# Patient Record
Sex: Male | Born: 1945 | Race: White | Hispanic: Yes | Marital: Married | Smoking: Former smoker
Health system: Southern US, Community
[De-identification: ages and names within clinical notes are randomized; demographics above are authoritative.]

## PROBLEM LIST (undated history)

## (undated) DIAGNOSIS — I4892 Unspecified atrial flutter: Secondary | ICD-10-CM

## (undated) DIAGNOSIS — E119 Type 2 diabetes mellitus without complications: Secondary | ICD-10-CM

## (undated) DIAGNOSIS — N189 Chronic kidney disease, unspecified: Secondary | ICD-10-CM

## (undated) DIAGNOSIS — I509 Heart failure, unspecified: Secondary | ICD-10-CM

## (undated) DIAGNOSIS — I82409 Acute embolism and thrombosis of unspecified deep veins of unspecified lower extremity: Secondary | ICD-10-CM

## (undated) DIAGNOSIS — E785 Hyperlipidemia, unspecified: Secondary | ICD-10-CM

## (undated) DIAGNOSIS — J45909 Unspecified asthma, uncomplicated: Secondary | ICD-10-CM

## (undated) DIAGNOSIS — I480 Paroxysmal atrial fibrillation: Secondary | ICD-10-CM

## (undated) DIAGNOSIS — I429 Cardiomyopathy, unspecified: Secondary | ICD-10-CM

## (undated) DIAGNOSIS — E079 Disorder of thyroid, unspecified: Secondary | ICD-10-CM

## (undated) DIAGNOSIS — I10 Essential (primary) hypertension: Secondary | ICD-10-CM

## (undated) HISTORY — DX: Disorder of thyroid, unspecified: E07.9

## (undated) HISTORY — DX: Type 2 diabetes mellitus without complications: E11.9

## (undated) HISTORY — PX: HERNIA REPAIR: SHX51

## (undated) HISTORY — DX: Hyperlipidemia, unspecified: E78.5

## (undated) HISTORY — DX: Essential (primary) hypertension: I10

---

## 2010-11-05 ENCOUNTER — Other Ambulatory Visit: Payer: Self-pay | Admitting: Specialist

## 2010-11-05 DIAGNOSIS — I82409 Acute embolism and thrombosis of unspecified deep veins of unspecified lower extremity: Secondary | ICD-10-CM

## 2010-11-05 DIAGNOSIS — R52 Pain, unspecified: Secondary | ICD-10-CM

## 2010-11-05 DIAGNOSIS — R609 Edema, unspecified: Secondary | ICD-10-CM

## 2010-11-08 ENCOUNTER — Ambulatory Visit
Admission: RE | Admit: 2010-11-08 | Discharge: 2010-11-08 | Disposition: A | Discharge status: Home / Self Care | Payer: Self-pay | Source: Ambulatory Visit | Attending: Specialist | Admitting: Specialist

## 2010-11-08 DIAGNOSIS — R52 Pain, unspecified: Secondary | ICD-10-CM

## 2010-11-08 DIAGNOSIS — R609 Edema, unspecified: Secondary | ICD-10-CM

## 2010-11-08 DIAGNOSIS — I82409 Acute embolism and thrombosis of unspecified deep veins of unspecified lower extremity: Secondary | ICD-10-CM

## 2011-04-29 ENCOUNTER — Other Ambulatory Visit: Payer: Self-pay | Admitting: Geriatric Medicine

## 2011-04-29 ENCOUNTER — Ambulatory Visit
Admission: RE | Admit: 2011-04-29 | Discharge: 2011-04-29 | Disposition: A | Discharge status: Home / Self Care | Payer: No Typology Code available for payment source | Source: Ambulatory Visit | Attending: Geriatric Medicine | Admitting: Geriatric Medicine

## 2011-04-29 DIAGNOSIS — R05 Cough: Secondary | ICD-10-CM

## 2013-03-02 IMAGING — US US EXTREM LOW VENOUS*R*
1 series · 14 of 24 positions shown · non-contrast
Comparison: None

CLINICAL DATA: Pain and swelling

RIGHT LOWER EXTREMITY VENOUS DOPPLER ULTRASOUND
TECHNIQUE: Gray-scale sonography with compression, as well as color
and duplex ultrasound, were performed to evaluate the deep venous
system from the level of the common femoral vein through the
popliteal and proximal calf veins.

[Series 1: us extrem low venous*right* · 14 of 30 slices shown]
[im 1/30]
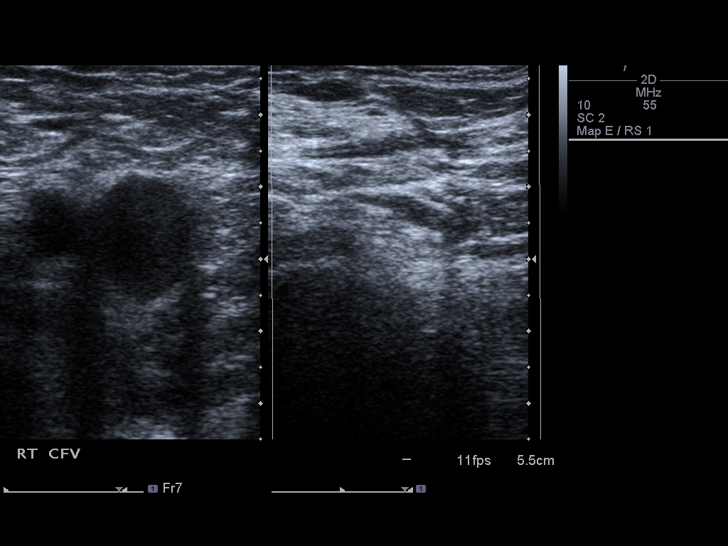
[im 3/30]
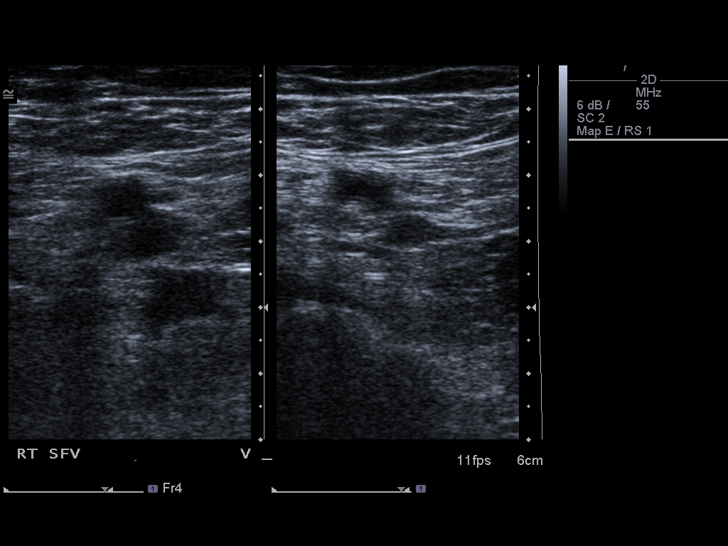
[im 6/30]
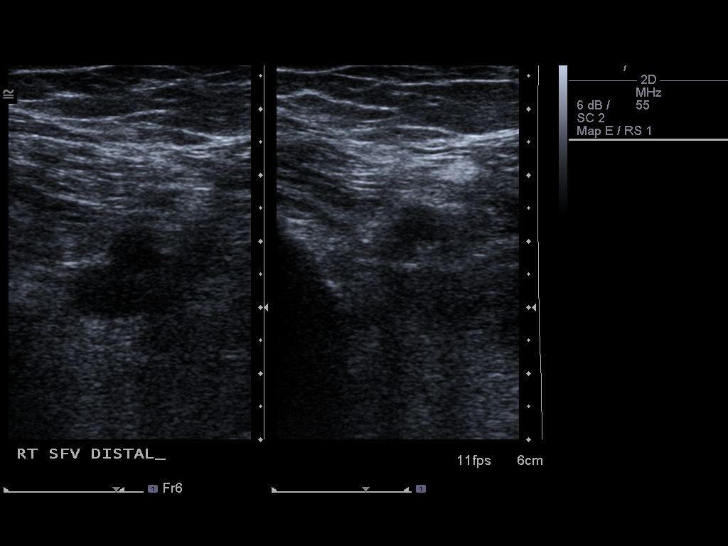
[im 8/30]
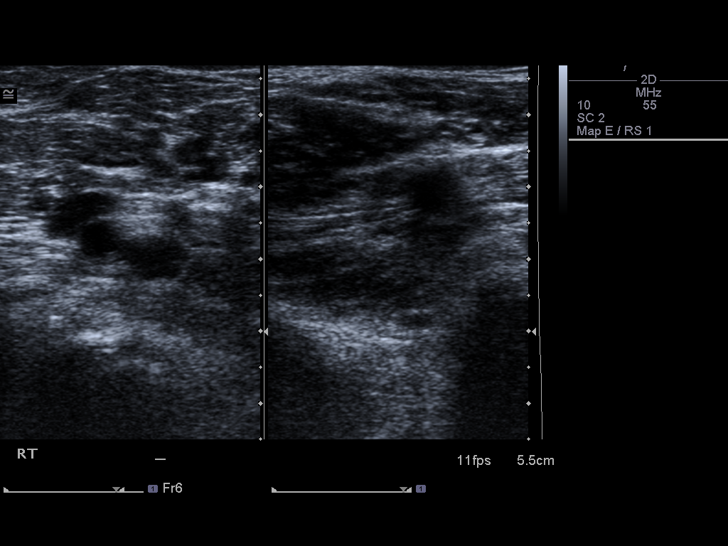
[im 9/30]
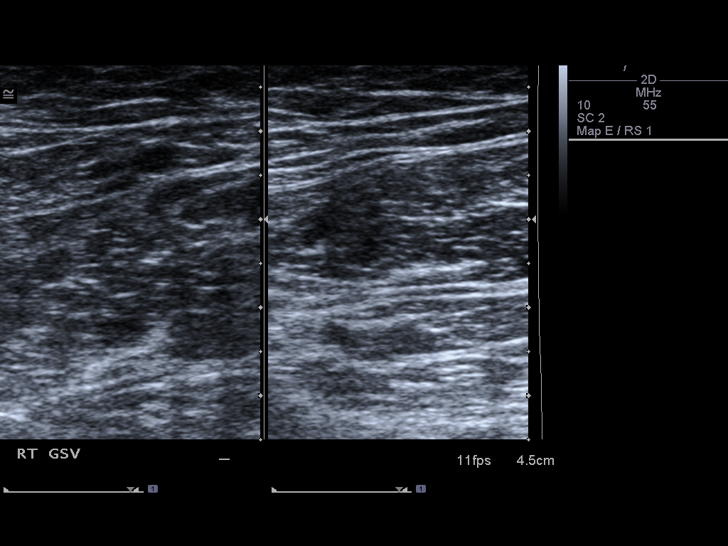
[im 12/30]
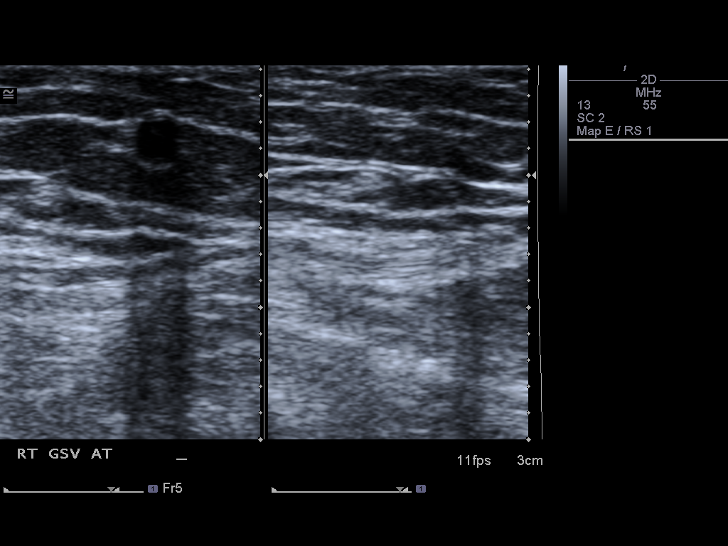
[im 14/30]
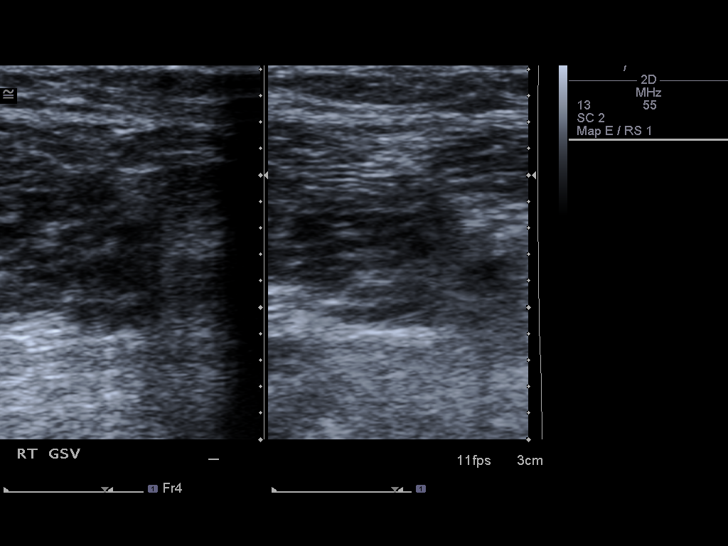
[im 16/30]
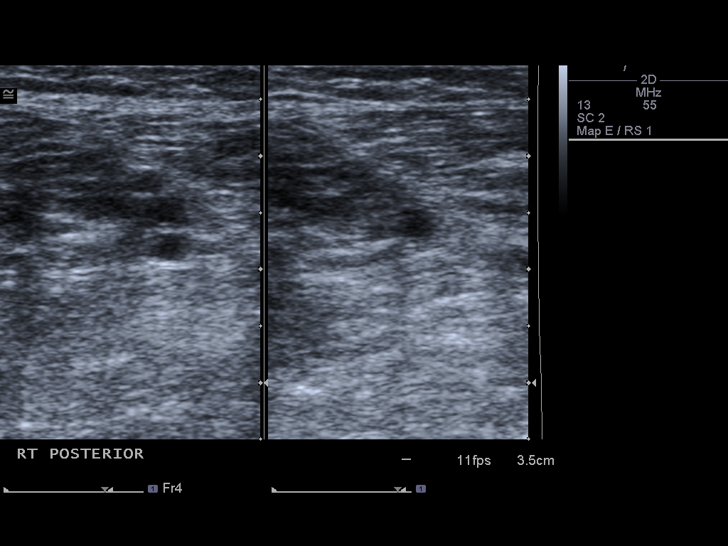
[im 18/30]
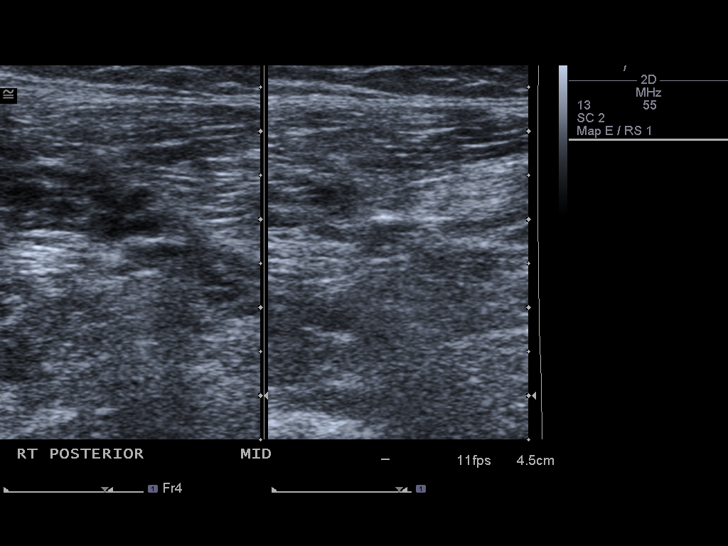
[im 21/30]
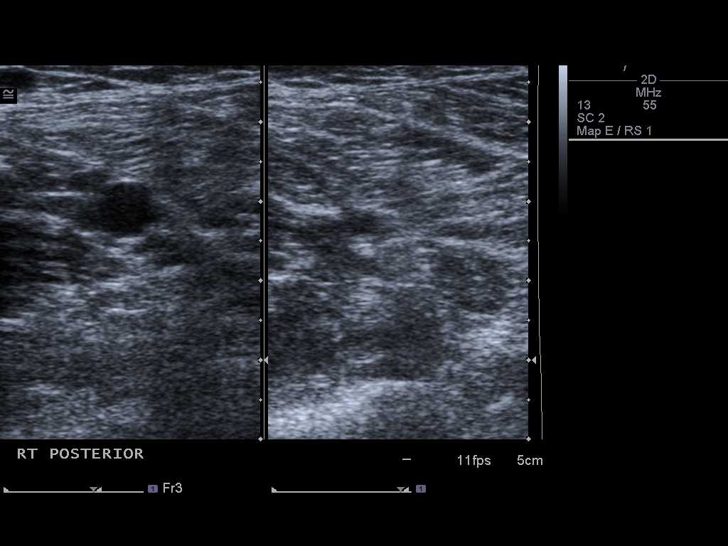
[im 23/30]
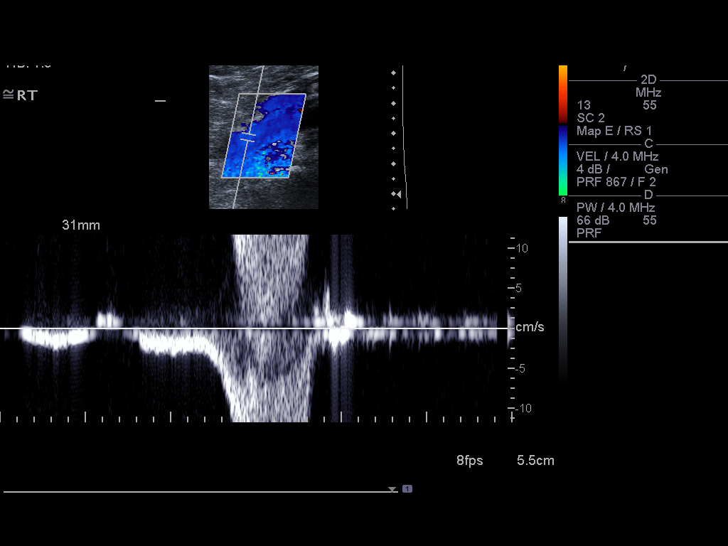
[im 24/30]
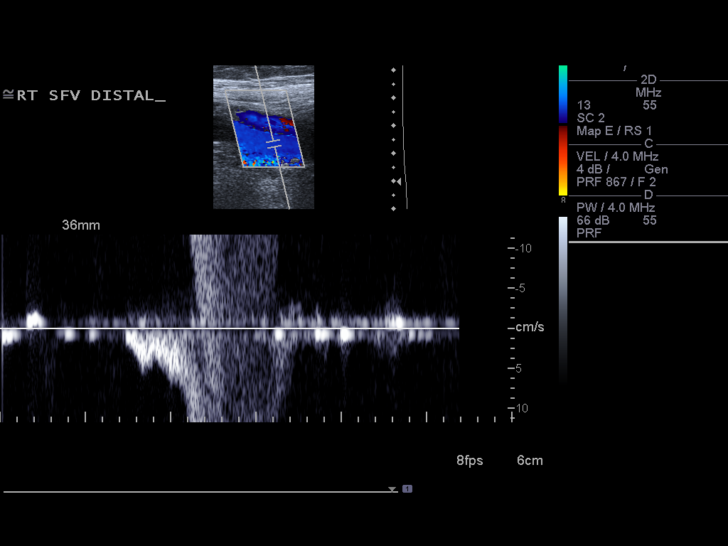
[im 27/30]
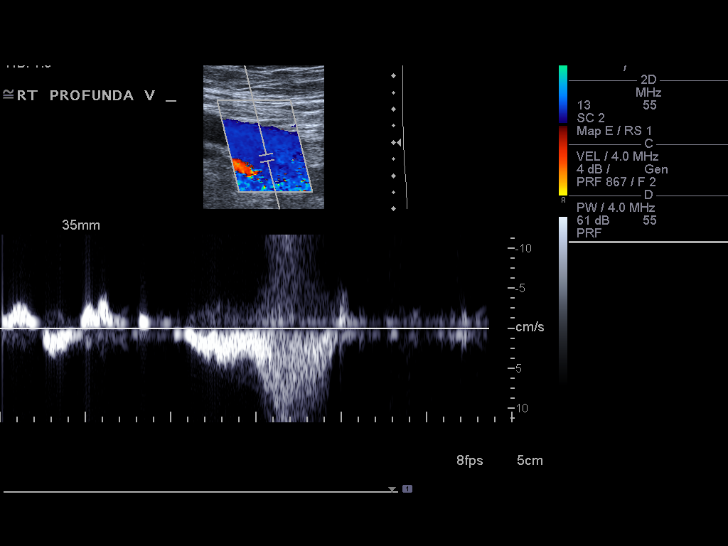
[im 30/30]
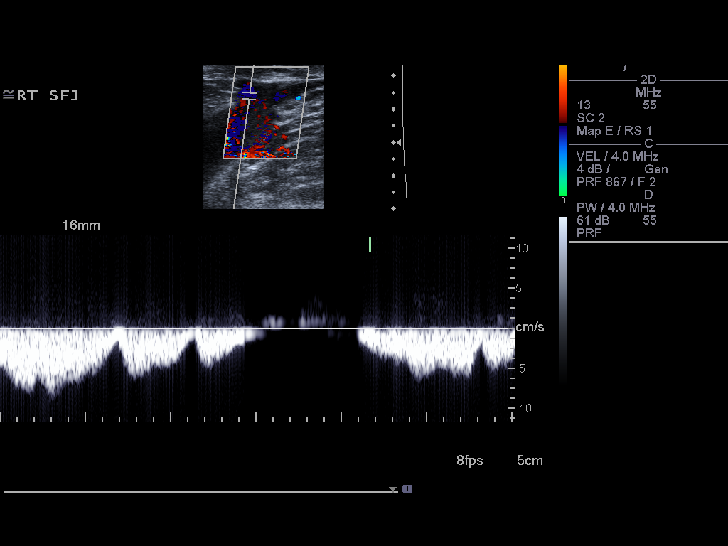

[14 of 24 positions shown; findings below may reference images not displayed]

FINDINGS: Normal compressibility of  the common femoral,
superficial femoral, and popliteal veins, as well as the proximal
calf veins.  No filling defects to suggest DVT on grayscale or
color Doppler imaging.  Doppler waveforms show normal direction of
venous flow, normal respiratory phasicity and response to
augmentation.
IMPRESSION: No evidence of  lower extremity deep vein thrombosis.

## 2014-09-15 ENCOUNTER — Ambulatory Visit (INDEPENDENT_AMBULATORY_CARE_PROVIDER_SITE_OTHER): Payer: Medicare Other | Admitting: Family Medicine

## 2014-09-15 ENCOUNTER — Ambulatory Visit (INDEPENDENT_AMBULATORY_CARE_PROVIDER_SITE_OTHER): Payer: Medicare Other

## 2014-09-15 VITALS — BP 126/80 | HR 92 | Temp 99.2°F | Resp 17 | Ht 65.5 in | Wt 186.0 lb

## 2014-09-15 DIAGNOSIS — E1142 Type 2 diabetes mellitus with diabetic polyneuropathy: Secondary | ICD-10-CM

## 2014-09-15 DIAGNOSIS — R05 Cough: Secondary | ICD-10-CM

## 2014-09-15 DIAGNOSIS — R059 Cough, unspecified: Secondary | ICD-10-CM

## 2014-09-15 DIAGNOSIS — E785 Hyperlipidemia, unspecified: Secondary | ICD-10-CM | POA: Diagnosis not present

## 2014-09-15 DIAGNOSIS — E039 Hypothyroidism, unspecified: Secondary | ICD-10-CM | POA: Diagnosis not present

## 2014-09-15 DIAGNOSIS — I10 Essential (primary) hypertension: Secondary | ICD-10-CM

## 2014-09-15 DIAGNOSIS — J988 Other specified respiratory disorders: Secondary | ICD-10-CM

## 2014-09-15 DIAGNOSIS — R0602 Shortness of breath: Secondary | ICD-10-CM | POA: Diagnosis not present

## 2014-09-15 DIAGNOSIS — J22 Unspecified acute lower respiratory infection: Secondary | ICD-10-CM

## 2014-09-15 LAB — POCT CBC
Granulocyte percent: 78.9 %G (ref 37–80)
HCT, POC: 38.2 % — AB (ref 43.5–53.7)
Hemoglobin: 12.5 g/dL — AB (ref 14.1–18.1)
Lymph, poc: 1.8 (ref 0.6–3.4)
MCH, POC: 29.1 pg (ref 27–31.2)
MCHC: 32.8 g/dL (ref 31.8–35.4)
MCV: 88.7 fL (ref 80–97)
MID (cbc): 0.7 (ref 0–0.9)
MPV: 6.6 fL (ref 0–99.8)
POC Granulocyte: 9.3 — AB (ref 2–6.9)
POC LYMPH PERCENT: 15 % (ref 10–50)
POC MID %: 6.1 %M (ref 0–12)
Platelet Count, POC: 258 10*3/uL (ref 142–424)
RBC: 4.31 M/uL — AB (ref 4.69–6.13)
RDW, POC: 14.3 %
WBC: 11.8 10*3/uL — AB (ref 4.6–10.2)

## 2014-09-15 LAB — GLUCOSE, POCT (MANUAL RESULT ENTRY): POC Glucose: 204 mg/dl — AB (ref 70–99)

## 2014-09-15 LAB — POCT GLYCOSYLATED HEMOGLOBIN (HGB A1C): Hemoglobin A1C: 7.4

## 2014-09-15 MED ORDER — IPRATROPIUM-ALBUTEROL 0.5-2.5 (3) MG/3ML IN SOLN: 3.0000 mL | Freq: Four times a day (QID) | RESPIRATORY_TRACT | Status: DC | PRN

## 2014-09-15 MED ORDER — AZITHROMYCIN 250 MG PO TABS: ORAL_TABLET | ORAL | Status: DC

## 2014-09-15 MED ORDER — HYDROCODONE-HOMATROPINE 5-1.5 MG/5ML PO SYRP: 5.0000 mL | ORAL_SOLUTION | Freq: Every evening | ORAL | Status: DC | PRN

## 2014-09-15 MED ORDER — ALBUTEROL SULFATE (2.5 MG/3ML) 0.083% IN NEBU
2.5000 mg | INHALATION_SOLUTION | Freq: Once | RESPIRATORY_TRACT | Status: AC
  Administered 2014-09-15: 2.5 mg via RESPIRATORY_TRACT

## 2014-09-15 MED ORDER — CEFTRIAXONE SODIUM 1 G IJ SOLR
1.0000 g | Freq: Once | INTRAMUSCULAR | Status: AC
  Administered 2014-09-15: 1 g via INTRAMUSCULAR

## 2014-09-15 MED ORDER — IPRATROPIUM BROMIDE 0.02 % IN SOLN
0.5000 mg | Freq: Once | RESPIRATORY_TRACT | Status: AC
  Administered 2014-09-15: 0.5 mg via RESPIRATORY_TRACT

## 2014-09-15 MED ORDER — METHYLPREDNISOLONE (PAK) 4 MG PO TABS: ORAL_TABLET | ORAL | Status: DC

## 2014-09-15 NOTE — Progress Notes (Signed)
Chief Complaint:  Chief Complaint  Patient presents with  . Cough  . URI  . Nasal Congestion    HPI: William Velasquez is a 69 y.o. male who is here for  cough symptoms, upper respiratory symptoms, nasal congestion; Feeling sick since THursday. He is here for check has any pneumonia. He is short of breath. He has had chills but no fevers. He got the flu vaccine. He feels his body is sore.He is coughing up productive green sputum, has tried theraflu tea and then cough has improved some. He denies any nausea, vomiting, abdominal pain, rashes, tachycardia. He had some leftover albuterol which was also expired, and has used it in his nebulizer machine. He takes his medications for his chronic illnesses regular and does not have any side effects. 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  Past Medical History  Diagnosis Date  . Diabetes mellitus without complication   . Hyperlipidemia   . Hypertension   . Thyroid disease    Past Surgical History  Procedure Laterality Date  . Hernia repair      inguinal   History   Social History  . Marital Status: Married    Spouse Name: N/A  . Number of Children: N/A  . Years of Education: N/A   Social History Main Topics  . Smoking status: Never Smoker   . Smokeless tobacco: Not on file  . Alcohol Use: Not on file  . Drug Use: Not on file  . Sexual Activity: Not on file   Other Topics Concern  . None   Social History Narrative  . None   History reviewed. No pertinent family history. Not on File Prior to Admission medications   Not on File     ROS: The patient denies fevers, night sweats, unintentional weight loss, chest pain, palpitations,  nausea, vomiting, abdominal pain, dysuria, hematuria, melena, numbness, weakness, or tingling.   All other systems have been reviewed and were otherwise negative with the exception of those mentioned in the HPI and as above.     PHYSICAL EXAM: Filed Vitals:   09/15/14 1214  BP: 126/80  Pulse: 92  Temp: 99.2 F (37.3 C)  Resp: 17   SpO2 Readings from Last 3 Encounters:  09/15/14 96%    Filed Vitals:   09/15/14 1214  Height: 5' 5.5" (1.664 m)  Weight: 186 lb (84.369 kg)   Body mass index is 30.47 kg/(m^2).  General: Alert, no acute distress, tired appearing HEENT:  Normocephalic, atraumatic, oropharynx patent. EOMI, PERRLA. Tympanic membrane normal, no tonsillar swelling, erythema or exudate Cardiovascular:  Regular rate and rhythm, no rubs murmurs or gallops.  No Carotid bruits, radial pulse intact. No pedal edema.  Respiratory: No wheezes, rales,  + rhonchi.  No cyanosis, no use of accessory musculature GI: No organomegaly, abdomen is soft and non-tender, positive bowel sounds.  No masses. Skin: No rashes. Neurologic: Facial musculature symmetric. Psychiatric: Patient is appropriate throughout our interaction. Lymphatic: No cervical lymphadenopathy Musculoskeletal: Gait intact.   LABS: Results for orders placed or performed in visit on 09/15/14  POCT glycosylated hemoglobin (Hb A1C)  Result Value Ref Range   Hemoglobin A1C 7.4   POCT glucose (manual entry)  Result Value Ref Range   POC Glucose 204 (A) 70 - 99 mg/dl  POCT CBC  Result Value Ref Range   WBC 11.8 (A) 4.6 - 10.2 K/uL   Lymph, poc 1.8 0.6 - 3.4   POC  LYMPH PERCENT 15.0 10 - 50 %L   MID (cbc) 0.7 0 - 0.9   POC MID % 6.1 0 - 12 %M   POC Granulocyte 9.3 (A) 2 - 6.9   Granulocyte percent 78.9 37 - 80 %G   RBC 4.31 (A) 4.69 - 6.13 M/uL   Hemoglobin 12.5 (A) 14.1 - 18.1 g/dL   HCT, POC 16.1 (A) 09.6 - 53.7 %   MCV 88.7 80 - 97 fL   MCH, POC 29.1 27 - 31.2 pg   MCHC 32.8 31.8 - 35.4 g/dL   RDW, POC 04.5 %   Platelet Count, POC 258 142 - 424 K/uL   MPV 6.6 0 - 99.8 fL     EKG/XRAY:   Primary read interpreted by Dr. Conley Rolls at [facility]. ? Pneumonia in Right mid-lower lobe, increase intersitial markings No  effusion    ASSESSMENT/PLAN: Encounter Diagnoses  Name Primary?  . Cough   . Type 2 diabetes mellitus with diabetic polyneuropathy   . SOB (shortness of breath)   . Lower respiratory infection (e.g., bronchitis, pneumonia, pneumonitis, pulmonitis)    William Velasquez is a pleasant 69 year old William Velasquez with a past medical history of hypertension, hyperlipidemia, type 2 diabetes, hypothyroidism who presents with a lower respiratory infection suspicious of pneumonia on x-ray. I did not test him for the flu since he is outside the window for treatment with Tamiflu. He has had a flu vaccine earlier in the year. He initially presented with an O2 sat duration of 92% after a Atrovent and albuterol nebulizer treatment he went up to 96% and felt better. Rocephin 1 g 1 in the office was given He was prescribed azithromycin to cover for pneumonia/bronchitis. He was also prescribed a Medrol Dosepak, Hycodan syrup to use nightly as needed, DuoNeb nebulizer solution to use every 6 hours while he's feeling poorly. He was also asked to use Mucinex Follow-up in one day if he is feeling worse or go to the ER when necessary, otherwise follow-up in 48 hours.  Gross sideeffects, risk and benefits, and alternatives of medications d/w patient. Patient is aware that all medications have potential sideeffects and we are unable to predict every sideeffect or drug-drug interaction that may occur.  William Garguilo PHUONG, DO 09/15/2014 2:15 PM

## 2014-09-15 NOTE — Patient Instructions (Signed)

## 2014-09-16 ENCOUNTER — Encounter: Payer: Self-pay | Admitting: Family Medicine

## 2014-09-16 DIAGNOSIS — E1142 Type 2 diabetes mellitus with diabetic polyneuropathy: Secondary | ICD-10-CM | POA: Insufficient documentation

## 2014-09-16 DIAGNOSIS — E039 Hypothyroidism, unspecified: Secondary | ICD-10-CM | POA: Insufficient documentation

## 2014-09-16 DIAGNOSIS — I10 Essential (primary) hypertension: Secondary | ICD-10-CM | POA: Insufficient documentation

## 2014-09-16 DIAGNOSIS — E785 Hyperlipidemia, unspecified: Secondary | ICD-10-CM | POA: Insufficient documentation

## 2014-09-16 LAB — COMPLETE METABOLIC PANEL WITH GFR
ALT: 12 U/L (ref 0–53)
AST: 12 U/L (ref 0–37)
Albumin: 3.8 g/dL (ref 3.5–5.2)
Alkaline Phosphatase: 60 U/L (ref 39–117)
BUN: 13 mg/dL (ref 6–23)
CO2: 29 mEq/L (ref 19–32)
Calcium: 9 mg/dL (ref 8.4–10.5)
Chloride: 97 mEq/L (ref 96–112)
Creat: 1 mg/dL (ref 0.50–1.35)
GFR, Est African American: >89 mL/min
GFR, Est Non African American: 78 mL/min
Glucose, Bld: 193 mg/dL — ABNORMAL HIGH (ref 70–99)
Potassium: 4.4 mEq/L (ref 3.5–5.3)
Sodium: 134 mEq/L — ABNORMAL LOW (ref 135–145)
Total Bilirubin: 0.6 mg/dL (ref 0.2–1.2)
Total Protein: 6.7 g/dL (ref 6.0–8.3)

## 2014-09-17 ENCOUNTER — Ambulatory Visit (INDEPENDENT_AMBULATORY_CARE_PROVIDER_SITE_OTHER): Payer: Medicare Other | Admitting: Family Medicine

## 2014-09-17 ENCOUNTER — Telehealth: Payer: Self-pay

## 2014-09-17 VITALS — BP 110/68 | HR 68 | Temp 97.9°F | Resp 16 | Ht 65.5 in | Wt 186.0 lb

## 2014-09-17 DIAGNOSIS — J189 Pneumonia, unspecified organism: Secondary | ICD-10-CM

## 2014-09-17 DIAGNOSIS — J040 Acute laryngitis: Secondary | ICD-10-CM | POA: Diagnosis not present

## 2014-09-17 NOTE — Patient Instructions (Signed)
Drink lots of water  Continue your current medications  The azithromycin will last in your system for 3 or 4 days after you have finished taking the pills. It will still be working on the pneumonia.  The laryngitis will get better when the cough and drainage improve  If you're getting worse at anytime, with more shortness of breath or running a high fever come back. If you are not doing much better by Saturday or Sunday come back for one more recheck.

## 2014-09-17 NOTE — Telephone Encounter (Signed)
[facility] [place] is calling because they needs the diagnoses code for chronic respiratory condition. The Pharmacist states that two other codes were sent but they are not work.

## 2014-09-17 NOTE — Progress Notes (Signed)
Subjective: Patient is here for a follow-up of the pneumonia that he was here for a few days ago. He is feeling better though not well yet. Taking his medications. Wanted to know if he needed another shot.  Objective: Throat clear. Neck supple without nodes. Chest is clear to auscultation. Heart regular without murmurs.  Assessment: Pneumonia improving  Plan: Return in 3 or 4 days if not much better in which case we would probably repeat another chest x-ray. Decided that was not yet needed today. Did not repeat the CBC because it would probably be elevated from the steroids.

## 2014-09-18 ENCOUNTER — Other Ambulatory Visit: Payer: Self-pay | Admitting: Family Medicine

## 2014-09-18 DIAGNOSIS — J189 Pneumonia, unspecified organism: Secondary | ICD-10-CM

## 2014-09-18 DIAGNOSIS — R05 Cough: Secondary | ICD-10-CM

## 2014-09-18 DIAGNOSIS — R059 Cough, unspecified: Secondary | ICD-10-CM

## 2014-09-18 DIAGNOSIS — R0602 Shortness of breath: Secondary | ICD-10-CM

## 2014-09-18 MED ORDER — ALBUTEROL SULFATE (2.5 MG/3ML) 0.083% IN NEBU: 2.5000 mg | INHALATION_SOLUTION | RESPIRATORY_TRACT | Status: DC | PRN

## 2014-09-18 MED ORDER — IPRATROPIUM BROMIDE 0.02 % IN SOLN: 0.5000 mg | Freq: Four times a day (QID) | RESPIRATORY_TRACT | Status: DC

## 2014-09-18 MED ORDER — ALBUTEROL SULFATE (2.5 MG/3ML) 0.083% IN NEBU: 2.5000 mg | INHALATION_SOLUTION | Freq: Four times a day (QID) | RESPIRATORY_TRACT | Status: DC | PRN

## 2014-09-18 MED ORDER — IPRATROPIUM BROMIDE 0.02 % IN SOLN: 0.5000 mg | Freq: Four times a day (QID) | RESPIRATORY_TRACT | Status: DC | PRN

## 2014-09-18 NOTE — Telephone Encounter (Signed)
I called in albuterol and atrovent separately . Pharmacist thinks it will be approved.

## 2014-09-18 NOTE — Telephone Encounter (Signed)
Dr Conley RollsLe, It looks like pharm probably needs another dx code to get the Duoneb covered. Must need a code for a chronic resp illness rather than the Dxs that are in your OV notes. Do you have another dx we can use for this?

## 2014-09-21 ENCOUNTER — Encounter: Payer: Self-pay | Admitting: Radiology

## 2014-09-27 ENCOUNTER — Encounter: Payer: Self-pay | Admitting: Family Medicine

## 2020-01-21 ENCOUNTER — Other Ambulatory Visit: Payer: Self-pay | Admitting: Orthopedic Surgery

## 2020-01-22 ENCOUNTER — Other Ambulatory Visit: Payer: Self-pay | Admitting: Orthopedic Surgery

## 2020-01-22 ENCOUNTER — Encounter (HOSPITAL_COMMUNITY): Payer: Self-pay

## 2020-01-22 NOTE — Patient Instructions (Addendum)
DUE TO COVID-19 ONLY ONE VISITOR ARE ALLOWED TO COME WITH YOU AND STAY IN THE WAITING ROOM ONLY DURING PRE OP AND PROCEDURE. THEN TWO VISITORS MAY VISIT WITH YOU IN YOUR PRIVATE ROOM DURING VISITING HOURS ONLY!! (10AM-8PM)   COVID SWAB TESTING MUST BE COMPLETED ON:     Monday, 01-27-20 @ 12:10 PM         [address] [place], [place] [address]  (Must self quarantine after testing. Follow instructions on handout.)        Your procedure is scheduled on:  Thursday, 01-30-20   Report to [facility] Main  Entrance     Report to admitting at 8:10 AM   Call this number if you have problems the morning of surgery [phone]   Do not eat food :After Midnight. May have liquids until  7:40 AM  day of surgery.Complete one G2 drink the morning of surgery at 7:40 AM  the day of surgery.   CLEAR LIQUID DIET  Foods Allowed                                                                     Foods Excluded  Water, Black Coffee and tea, regular and decaf            liquids that you cannot  Plain Jell-O in any flavor  (No red)                                      see through such as: Fruit ices (not with fruit pulp)                                      milk, soups, orange juice              Iced Popsicles (No red)                                      All solid food                                   Apple juices Sports drinks like Gatorade (No red) Lightly seasoned clear broth or consume(fat free) Sugar, honey syrup         Oral Hygiene is also important to reduce your risk of infection.                                     Remember - BRUSH YOUR TEETH THE MORNING OF SURGERY WITH YOUR REGULAR TOOTHPASTE.  AFTERWARDS NO WATER, GUM, CANDY OR MINTS   Do NOT smoke after Midnight   Take these medicines the morning of surgery with A SIP OF WATER: Atorvastatin (Lipitor), Carvedilol (Coreg), Levothyroxine. Ok to use inhalers  DO NOT TAKE ANY ORAL DIABETIC MEDICATIONS DAY OF YOUR SURGERY  You may not have any metal on your body including jewelry, and body piercings              Do not wear make-up, lotions, powders, cologne, or deodorant                          Men may shave face and neck.   Do not bring valuables to the hospital. [facility] IS NOT RESPONSIBLE   FOR VALUABLES.   Contacts, dentures or bridgework may not be worn into surgery.   Bring small overnight bag day of surgery.    Patients discharged the day of surgery will not be allowed to drive home.   Special Instructions: Bring a copy of your healthcare power of attorney and living will documents the day of surgery if you haven't scanned them in before.              Please read over the following fact sheets you were given: IF YOU HAVE QUESTIONS ABOUT YOUR PRE OP INSTRUCTIONS PLEASE CALL  [phone]   How to Manage Your Diabetes Before and After Surgery  Why is it important to control my blood sugar before and after surgery? . Improving blood sugar levels before and after surgery helps healing and can limit problems. . A way of improving blood sugar control is eating a healthy diet by: o  Eating less sugar and carbohydrates o  Increasing activity/exercise o  Talking with your doctor about reaching your blood sugar goals . High blood sugars (greater than 180 mg/dL) can raise your risk of infections and slow your recovery, so you will need to focus on controlling your diabetes during the weeks before surgery. . Make sure that the doctor who takes care of your diabetes knows about your planned surgery including the date and location.  How do I manage my blood sugar before surgery? . Check your blood sugar at least 4 times a day, starting 2 days before surgery, to make sure that the level is not too high or low. o Check your blood sugar the morning of your surgery when you wake up and every 2 hours until you get to the Short Stay unit. . If your blood sugar is less than 70 mg/dL,  you will need to treat for low blood sugar: o Do not take insulin. o Treat a low blood sugar (less than 70 mg/dL) with  cup of clear juice (cranberry or apple), 4 glucose tablets, OR glucose gel. o Recheck blood sugar in 15 minutes after treatment (to make sure it is greater than 70 mg/dL). If your blood sugar is not greater than 70 mg/dL on recheck, call [phone] for further instructions. . Report your blood sugar to the short stay nurse when you get to Short Stay.  . If you are admitted to the hospital after surgery: o Your blood sugar will be checked by the staff and you will probably be given insulin after surgery (instead of oral diabetes medicines) to make sure you have good blood sugar levels. o The goal for blood sugar control after surgery is 80-180 mg/dL.   WHAT DO I DO ABOUT MY DIABETES MEDICATION?  Marland Kitchen Do not take oral diabetes medicines (pills) the morning of surgery.  . THE DAY BEFORE SURGERY, take your usual Metformin, and Janumet. However, DONOT TAKE YOUR JARDIANCE.         Reviewed and Endorsed by [facility] Patient Education Committee, August 2015  [facility] - Preparing for Surgery Before surgery, you can play an important role.  Because skin is not sterile, your skin needs to be as free of germs as possible.  You can reduce the number of germs on your skin by washing with CHG (chlorahexidine gluconate) soap before surgery.  CHG is an antiseptic cleaner which kills germs and bonds with the skin to continue killing germs even after washing. Please DO NOT use if you have an allergy to CHG or antibacterial soaps.  If your skin becomes reddened/irritated stop using the CHG and inform your nurse when you arrive at Short Stay. Do not shave (including legs and underarms) for at least 48 hours prior to the first CHG shower.  You may shave your face/neck.  Please follow these instructions carefully:  1.  Shower with CHG Soap the night before surgery and the  morning of  surgery.  2.  If you choose to wash your hair, wash your hair first as usual with your normal  shampoo.  3.  After you shampoo, rinse your hair and body thoroughly to remove the shampoo.                             4.  Use CHG as you would any other liquid soap.  You can apply chg directly to the skin and wash.  Gently with a scrungie or clean washcloth.  5.  Apply the CHG Soap to your body ONLY FROM THE NECK DOWN.   Do   not use on face/ open                           Wound or open sores. Avoid contact with eyes, ears mouth and   genitals (private parts).                       Wash face,  Genitals (private parts) with your normal soap.             6.  Wash thoroughly, paying special attention to the area where your    surgery  will be performed.  7.  Thoroughly rinse your body with warm water from the neck down.  8.  DO NOT shower/wash with your normal soap after using and rinsing off the CHG Soap.                9.  Pat yourself dry with a clean towel.            10.  Wear clean pajamas.            11.  Place clean sheets on your bed the night of your first shower and do not  sleep with pets. Day of Surgery : Do not apply any lotions/deodorants the morning of surgery.  Please wear clean clothes to the hospital/surgery center.  FAILURE TO FOLLOW THESE INSTRUCTIONS MAY RESULT IN THE CANCELLATION OF YOUR SURGERY  PATIENT SIGNATURE_________________________________  NURSE SIGNATURE__________________________________  ________________________________________________________________________    Rogelia Mire  An incentive spirometer is a tool that can help keep your lungs clear and active. This tool measures how well you are filling your lungs with each breath. Taking long deep breaths may help reverse or decrease the chance of developing breathing (pulmonary) problems (especially infection) following:  A long period of time when you are unable to move or be active. BEFORE THE  PROCEDURE  If the spirometer includes an indicator to show your best effort, your nurse or respiratory therapist will set it to a desired goal.  If possible, sit up straight or lean slightly forward. Try not to slouch.  Hold the incentive spirometer in an upright position. INSTRUCTIONS FOR USE  1. Sit on the edge of your bed if possible, or sit up as far as you can in bed or on a chair. 2. Hold the incentive spirometer in an upright position. 3. Breathe out normally. 4. Place the mouthpiece in your mouth and seal your lips tightly around it. 5. Breathe in slowly and as deeply as possible, raising the piston or the ball toward the top of the column. 6. Hold your breath for 3-5 seconds or for as long as possible. Allow the piston or ball to fall to the bottom of the column. 7. Remove the mouthpiece from your mouth and breathe out normally. 8. Rest for a few seconds and repeat Steps 1 through 7 at least 10 times every 1-2 hours when you are awake. Take your time and take a few normal breaths between deep breaths. 9. The spirometer may include an indicator to show your best effort. Use the indicator as a goal to work toward during each repetition. 10. After each set of 10 deep breaths, practice coughing to be sure your lungs are clear. If you have an incision (the cut made at the time of surgery), support your incision when coughing by placing a pillow or rolled up towels firmly against it. Once you are able to get out of bed, walk around indoors and cough well. You may stop using the incentive spirometer when instructed by your caregiver.  RISKS AND COMPLICATIONS  Take your time so you do not get dizzy or light-headed.  If you are in pain, you may need to take or ask for pain medication before doing incentive spirometry. It is harder to take a deep breath if you are having pain. AFTER USE  Rest and breathe slowly and easily.  It can be helpful to keep track of a log of your progress. Your  caregiver can provide you with a simple table to help with this. If you are using the spirometer at home, follow these instructions: SEEK MEDICAL CARE IF:   You are having difficultly using the spirometer.  You have trouble using the spirometer as often as instructed.  Your pain medication is not giving enough relief while using the spirometer.  You develop fever of 100.5 F (38.1 C) or higher. SEEK IMMEDIATE MEDICAL CARE IF:   You cough up bloody sputum that had not been present before.  You develop fever of 102 F (38.9 C) or greater.  You develop worsening pain at or near the incision site. MAKE SURE YOU:   Understand these instructions.  Will watch your condition.  Will get help right away if you are not doing well or get worse. Document Released: 10/24/2006 Document Revised: 09/05/2011 Document Reviewed: 12/25/2006 ExitCare Patient Information 2014 ExitCare, [place].   ________________________________________________________________________  WHAT IS A BLOOD TRANSFUSION? Blood Transfusion Information  A transfusion is the replacement of blood or some of its parts. Blood is made up of multiple cells which provide different functions.  Red blood cells carry oxygen and are used for blood loss replacement.  White blood cells fight against infection.  Platelets control bleeding.  Plasma helps clot blood.  Other blood products are available for specialized needs, such as hemophilia or other clotting disorders.  BEFORE THE TRANSFUSION  Who gives blood for transfusions?   Healthy volunteers who are fully evaluated to make sure their blood is safe. This is blood bank blood. Transfusion therapy is the safest it has ever been in the practice of medicine. Before blood is taken from a donor, a complete history is taken to make sure that person has no history of diseases nor engages in risky social behavior (examples are intravenous drug use or sexual activity with multiple  partners). The donor's travel history is screened to minimize risk of transmitting infections, such as malaria. The donated blood is tested for signs of infectious diseases, such as HIV and hepatitis. The blood is then tested to be sure it is compatible with you in order to minimize the chance of a transfusion reaction. If you or a relative donates blood, this is often done in anticipation of surgery and is not appropriate for emergency situations. It takes many days to process the donated blood. RISKS AND COMPLICATIONS Although transfusion therapy is very safe and saves many lives, the main dangers of transfusion include:   Getting an infectious disease.  Developing a transfusion reaction. This is an allergic reaction to something in the blood you were given. Every precaution is taken to prevent this. The decision to have a blood transfusion has been considered carefully by your caregiver before blood is given. Blood is not given unless the benefits outweigh the risks. AFTER THE TRANSFUSION  Right after receiving a blood transfusion, you will usually feel much better and more energetic. This is especially true if your red blood cells have gotten low (anemic). The transfusion raises the level of the red blood cells which carry oxygen, and this usually causes an energy increase.  The nurse administering the transfusion will monitor you carefully for complications. HOME CARE INSTRUCTIONS  No special instructions are needed after a transfusion. You may find your energy is better. Speak with your caregiver about any limitations on activity for underlying diseases you may have. SEEK MEDICAL CARE IF:   Your condition is not improving after your transfusion.  You develop redness or irritation at the intravenous (IV) site. SEEK IMMEDIATE MEDICAL CARE IF:  Any of the following symptoms occur over the next 12 hours:  Shaking chills.  You have a temperature by mouth above 102 F (38.9 C), not  controlled by medicine.  Chest, back, or muscle pain.  People around you feel you are not acting correctly or are confused.  Shortness of breath or difficulty breathing.  Dizziness and fainting.  You get a rash or develop hives.  You have a decrease in urine output.  Your urine turns a dark color or changes to pink, red, or brown. Any of the following symptoms occur over the next 10 days:  You have a temperature by mouth above 102 F (38.9 C), not controlled by medicine.  Shortness of breath.  Weakness after normal activity.  The white part of the eye turns yellow (jaundice).  You have a decrease in the amount of urine or are urinating less often.  Your urine turns a dark color or changes to pink, red, or brown. Document Released: 06/10/2000 Document Revised: 09/05/2011 Document Reviewed: 01/28/2008 [employer] [facility] Patient Information 2014 [place].  _______________________________________________________________________

## 2020-01-22 NOTE — Progress Notes (Signed)
COVID Vaccine Completed: x2 Date COVID Vaccine completed: 08-28-19 & 09-25-19 COVID vaccine manufacturer: Pfizer    Moderna   [employer]   PCP -  Cardiologist - Dr. Lorelei Pont [place].  Last OV 11-07-19  Clearance received and on chart  Chest x-ray -  EKG - 01-27-20 in Epic (PAT visit) Stress Test - 03-06-17 Care Everywhere ECHO - 11-07-19 Care Everywhere Cardiac Cath -   Sleep Study -  CPAP -   Fasting Blood Sugar -  Checks Blood Sugar _____ times a day Hgb A1c 7.4 01-06-20 in Care Everywhere  Blood Thinner Instructions: Xarelto Aspirin Instructions: ASA 325 Last Dose:  Anesthesia review: Afib, Aflutter, cardiomyopathy, CHF, HTN, DM II  Patient denies shortness of breath, fever, cough and chest pain at PAT appointment   Patient verbalized understanding of instructions that were given to them at the PAT appointment. Patient was also instructed that they will need to review over the PAT instructions again at home before surgery.

## 2020-01-27 ENCOUNTER — Other Ambulatory Visit (HOSPITAL_COMMUNITY): Payer: Self-pay

## 2020-01-27 ENCOUNTER — Encounter (HOSPITAL_COMMUNITY): Payer: Self-pay

## 2020-01-27 ENCOUNTER — Other Ambulatory Visit (HOSPITAL_COMMUNITY)
Admission: RE | Admit: 2020-01-27 | Discharge: 2020-01-27 | Disposition: A | Discharge status: Home / Self Care | Payer: No Typology Code available for payment source | Source: Ambulatory Visit | Attending: Orthopedic Surgery | Admitting: Orthopedic Surgery

## 2020-01-27 ENCOUNTER — Ambulatory Visit (HOSPITAL_COMMUNITY)
Admission: RE | Admit: 2020-01-27 | Discharge: 2020-01-27 | Disposition: A | Discharge status: Home / Self Care | Payer: No Typology Code available for payment source | Source: Ambulatory Visit | Attending: Orthopedic Surgery | Admitting: Orthopedic Surgery

## 2020-01-27 ENCOUNTER — Encounter (HOSPITAL_COMMUNITY)
Admission: RE | Admit: 2020-01-27 | Discharge: 2020-01-27 | Disposition: A | Discharge status: Home / Self Care | Payer: No Typology Code available for payment source | Source: Ambulatory Visit | Attending: Orthopedic Surgery | Admitting: Orthopedic Surgery

## 2020-01-27 ENCOUNTER — Other Ambulatory Visit: Payer: Self-pay

## 2020-01-27 DIAGNOSIS — I509 Heart failure, unspecified: Secondary | ICD-10-CM | POA: Insufficient documentation

## 2020-01-27 DIAGNOSIS — E785 Hyperlipidemia, unspecified: Secondary | ICD-10-CM | POA: Insufficient documentation

## 2020-01-27 DIAGNOSIS — Z01818 Encounter for other preprocedural examination: Secondary | ICD-10-CM | POA: Diagnosis present

## 2020-01-27 DIAGNOSIS — E1122 Type 2 diabetes mellitus with diabetic chronic kidney disease: Secondary | ICD-10-CM | POA: Diagnosis not present

## 2020-01-27 DIAGNOSIS — Z7902 Long term (current) use of antithrombotics/antiplatelets: Secondary | ICD-10-CM | POA: Diagnosis not present

## 2020-01-27 DIAGNOSIS — I4892 Unspecified atrial flutter: Secondary | ICD-10-CM | POA: Insufficient documentation

## 2020-01-27 DIAGNOSIS — Z01811 Encounter for preprocedural respiratory examination: Secondary | ICD-10-CM

## 2020-01-27 DIAGNOSIS — Z7982 Long term (current) use of aspirin: Secondary | ICD-10-CM | POA: Insufficient documentation

## 2020-01-27 DIAGNOSIS — Z20822 Contact with and (suspected) exposure to covid-19: Secondary | ICD-10-CM | POA: Diagnosis not present

## 2020-01-27 DIAGNOSIS — I480 Paroxysmal atrial fibrillation: Secondary | ICD-10-CM | POA: Diagnosis not present

## 2020-01-27 DIAGNOSIS — I130 Hypertensive heart and chronic kidney disease with heart failure and stage 1 through stage 4 chronic kidney disease, or unspecified chronic kidney disease: Secondary | ICD-10-CM | POA: Insufficient documentation

## 2020-01-27 DIAGNOSIS — Z79899 Other long term (current) drug therapy: Secondary | ICD-10-CM | POA: Diagnosis not present

## 2020-01-27 DIAGNOSIS — E079 Disorder of thyroid, unspecified: Secondary | ICD-10-CM | POA: Diagnosis not present

## 2020-01-27 DIAGNOSIS — M75101 Unspecified rotator cuff tear or rupture of right shoulder, not specified as traumatic: Secondary | ICD-10-CM | POA: Diagnosis not present

## 2020-01-27 DIAGNOSIS — I429 Cardiomyopathy, unspecified: Secondary | ICD-10-CM | POA: Insufficient documentation

## 2020-01-27 DIAGNOSIS — Z7901 Long term (current) use of anticoagulants: Secondary | ICD-10-CM | POA: Insufficient documentation

## 2020-01-27 DIAGNOSIS — N189 Chronic kidney disease, unspecified: Secondary | ICD-10-CM | POA: Diagnosis not present

## 2020-01-27 DIAGNOSIS — Z87891 Personal history of nicotine dependence: Secondary | ICD-10-CM | POA: Diagnosis not present

## 2020-01-27 HISTORY — DX: Unspecified asthma, uncomplicated: J45.909

## 2020-01-27 HISTORY — DX: Acute embolism and thrombosis of unspecified deep veins of unspecified lower extremity: I82.409

## 2020-01-27 HISTORY — DX: Cardiomyopathy, unspecified: I42.9

## 2020-01-27 HISTORY — DX: Heart failure, unspecified: I50.9

## 2020-01-27 HISTORY — DX: Chronic kidney disease, unspecified: N18.9

## 2020-01-27 HISTORY — DX: Unspecified atrial flutter: I48.92

## 2020-01-27 HISTORY — DX: Paroxysmal atrial fibrillation: I48.0

## 2020-01-27 LAB — URINALYSIS, ROUTINE W REFLEX MICROSCOPIC
Bacteria, UA: NONE SEEN
Bilirubin Urine: NEGATIVE
Glucose, UA: >=500 mg/dL — AB
Hgb urine dipstick: NEGATIVE
Ketones, ur: NEGATIVE mg/dL
Leukocytes,Ua: NEGATIVE
Nitrite: NEGATIVE
Protein, ur: NEGATIVE mg/dL
Specific Gravity, Urine: 1.006 (ref 1.005–1.030)
pH: 5 (ref 5.0–8.0)

## 2020-01-27 LAB — CBC WITH DIFFERENTIAL/PLATELET
Abs Immature Granulocytes: 0.01 10*3/uL (ref 0.00–0.07)
Basophils Absolute: 0.1 10*3/uL (ref 0.0–0.1)
Basophils Relative: 1 %
Eosinophils Absolute: 0.9 10*3/uL — ABNORMAL HIGH (ref 0.0–0.5)
Eosinophils Relative: 12 %
HCT: 44.6 % (ref 39.0–52.0)
Hemoglobin: 14.6 g/dL (ref 13.0–17.0)
Immature Granulocytes: 0 %
Lymphocytes Relative: 22 %
Lymphs Abs: 1.6 10*3/uL (ref 0.7–4.0)
MCH: 30.8 pg (ref 26.0–34.0)
MCHC: 32.7 g/dL (ref 30.0–36.0)
MCV: 94.1 fL (ref 80.0–100.0)
Monocytes Absolute: 0.8 10*3/uL (ref 0.1–1.0)
Monocytes Relative: 11 %
Neutro Abs: 4.2 10*3/uL (ref 1.7–7.7)
Neutrophils Relative %: 54 %
Platelets: 186 10*3/uL (ref 150–400)
RBC: 4.74 MIL/uL (ref 4.22–5.81)
RDW: 13.6 % (ref 11.5–15.5)
WBC: 7.6 10*3/uL (ref 4.0–10.5)
nRBC: 0 % (ref 0.0–0.2)

## 2020-01-27 LAB — PROTIME-INR
INR: 1.6 — ABNORMAL HIGH (ref 0.8–1.2)
Prothrombin Time: 18.5 seconds — ABNORMAL HIGH (ref 11.4–15.2)

## 2020-01-27 LAB — COMPREHENSIVE METABOLIC PANEL
ALT: 23 U/L (ref 0–44)
AST: 21 U/L (ref 15–41)
Albumin: 4.6 g/dL (ref 3.5–5.0)
Alkaline Phosphatase: 60 U/L (ref 38–126)
Anion gap: 12 (ref 5–15)
BUN: 36 mg/dL — ABNORMAL HIGH (ref 8–23)
CO2: 29 mmol/L (ref 22–32)
Calcium: 9.6 mg/dL (ref 8.9–10.3)
Chloride: 99 mmol/L (ref 98–111)
Creatinine, Ser: 1.34 mg/dL — ABNORMAL HIGH (ref 0.61–1.24)
GFR calc Af Amer: >60 mL/min (ref >60)
GFR calc non Af Amer: 53 mL/min — ABNORMAL LOW (ref >60)
Glucose, Bld: 163 mg/dL — ABNORMAL HIGH (ref 70–99)
Potassium: 4.8 mmol/L (ref 3.5–5.1)
Sodium: 140 mmol/L (ref 135–145)
Total Bilirubin: 0.6 mg/dL (ref 0.3–1.2)
Total Protein: 8.2 g/dL — ABNORMAL HIGH (ref 6.5–8.1)

## 2020-01-27 LAB — APTT: aPTT: 47 seconds — ABNORMAL HIGH (ref 24–36)

## 2020-01-27 LAB — SARS CORONAVIRUS 2 (TAT 6-24 HRS): SARS Coronavirus 2: NEGATIVE

## 2020-01-27 LAB — GLUCOSE, CAPILLARY: Glucose-Capillary: 147 mg/dL — ABNORMAL HIGH (ref 70–99)

## 2020-01-27 LAB — SURGICAL PCR SCREEN
MRSA, PCR: NEGATIVE
Staphylococcus aureus: NEGATIVE

## 2020-01-27 NOTE — Progress Notes (Signed)
COVID Vaccine Completed: x2 Date COVID Vaccine completed: 08-28-19 & 09-25-19 COVID vaccine manufacturer: Pfizer    Moderna   [employer]   PCP - Dr. Jeanne [facility] Cardiologist - Dr. Billey Co.  Last OV 11-07-19 No back stimulator   Clearance received and on chart  Chest x-ray -  EKG - 01-27-20 in Epic (PAT visit) Stress Test - 03-06-17 Care Everywhere ECHO - 11-07-19 Care Everywhere Cardiac Cath -   Sleep Study -  CPAP -   Fasting Blood Sugar - 117-187  Checks Blood Sugar _daily Hgb A1c 7.4 01-06-20 in Care Everywhere  Blood Thinner Instructions: Xarelto  Aspirin Instructions: ASA 325 Last Dose:Last dose 01-26-20  Can complete ADL w/o SOB  Anesthesia review: Afib, Aflutter, cardiomyopathy, CHF, HTN, DM II  Patient denies shortness of breath, fever, cough and chest pain at PAT appointment

## 2020-01-28 NOTE — Anesthesia Preprocedure Evaluation (Addendum)
Anesthesia Evaluation  Patient identified by MRN, date of birth, ID band Patient awake    Reviewed: Allergy & Precautions, NPO status , Patient's Chart, lab work & pertinent test results, reviewed documented beta blocker date and time   Airway Mallampati: III  TM Distance: >3 FB Neck ROM: Full    Dental no notable dental hx. (+) Edentulous Upper, Missing, Dental Advisory Given   Pulmonary asthma , former smoker,  Quit smoking 2008, 20 pack year history- uses albuterol inhaler daily Does not see pulmonologist     Pulmonary exam normal breath sounds clear to auscultation       Cardiovascular hypertension, Pt. on home beta blockers and Pt. on medications +CHF (mild diastolic dysfunction, nml LVEF) and + DVT  Normal cardiovascular exam+ dysrhythmias Atrial Fibrillation + Valvular Problems/Murmurs (mild AI, mild-mod MR) MR and AI  Rhythm:Regular Rate:Normal  Hx DVT, PAF on xarelto- last dose 01/26/20  Last echo 10/2019: Echo 11/07/2019 Left Ventricle: The calculated left ventricular ejection fraction is  53%. Systolic function is low normal. EF: 50-55%. Doppler parameters  consistent with mild diastolic dysfunction and low to normal LA pressure.  . Left Atrium: Left atrium is mildlydilated.  . Aortic Valve: Mild regurgitation.  . Mitral Valve: The leaflets are mildly thickened and exhibit normal  excursion. There is mild to moderate regurgitation.  . Pericardium: There is no pericardial effusion.   Neuro/Psych negative neurological ROS  negative psych ROS   GI/Hepatic negative GI ROS, Neg liver ROS,   Endo/Other  diabetes, Poorly Controlled, Type 2, Oral Hypoglycemic AgentsHypothyroidism A1c 7.4, fasting FS this AM 186- does not use insulin at home  Renal/GU Renal InsufficiencyRenal diseaseCr 1.34, CKD   negative genitourinary   Musculoskeletal Right shoulder rotator cuff tear    Abdominal Normal abdominal exam  (+)    Peds  Hematology negative hematology ROS (+)   Anesthesia Other Findings Speaks some english, mostly spanish   Reproductive/Obstetrics negative OB ROS                          Anesthesia Physical Anesthesia Plan  ASA: III  Anesthesia Plan: General and Regional   Post-op Pain Management: GA combined w/ Regional for post-op pain   Induction: Intravenous  PONV Risk Score and Plan: Ondansetron, Dexamethasone and Treatment may vary due to age or medical condition  Airway Management Planned: Oral ETT  Additional Equipment: None  Intra-op Plan:   Post-operative Plan: Extubation in OR  Informed Consent: I have reviewed the patients History and Physical, chart, labs and discussed the procedure including the risks, benefits and alternatives for the proposed anesthesia with the patient or authorized representative who has indicated his/her understanding and acceptance.     Dental advisory given  Plan Discussed with: CRNA  Anesthesia Plan Comments:       Anesthesia Quick Evaluation

## 2020-01-28 NOTE — Progress Notes (Signed)
Anesthesia Chart Review   Case: [id number] Date/Time: 01/30/20 1026   Procedure: REVERSE SHOULDER ARTHROPLASTY (Right Shoulder)   Anesthesia type: Choice   Pre-op diagnosis: RIGHT SHOULDER ROTATOR CUFF TEAR ARTHROPATHY   Location: [facility] ROOM 07 / [facility]   Surgeons: Jones Broom, MD      DISCUSSION:74 y.o. former smoker (20 pack years, quit 06/27/06) with h/o HLD, HTN, DM II, CHF, asthma, PAF (on Xarelto), CKD, right shoulder rotator cuff tear scheduled for above procedure 01/30/2020 with Dr. Jones Broom.   Pt last seen by PCP 01/06/20.  Per OV note A1C 7.4, down from 8.1.  HTN stable.   Pt last seen by cardiology 09/11/2019.  Clearance from cardiology on chart which states pt is low risk.  Advised to stop Xarelto 2 days prior to procedure.  Pt currently scheduled "choice" anesthesia.  Pt reported to PAT nurse his last dose of Xarelto was 01/26/2020.   Anticipate pt can proceed with planned procedure barring acute status change.   VS: BP (!) 166/81   Pulse 61   Temp 36.7 C (Oral)   Resp 16   Ht 5\' 6"  (1.676 m)   Wt 80.7 kg   SpO2 100%   BMI 28.73 kg/m   PROVIDERS: , NP is PCP   Jeraldine Loots, MD is Cardiologist  LABS: Labs reviewed: Acceptable for surgery. (all labs ordered are listed, but only abnormal results are displayed)  Labs Reviewed  APTT - Abnormal; Notable for the following components:      Result Value   aPTT 47 (*)    All other components within normal limits  CBC WITH DIFFERENTIAL/PLATELET - Abnormal; Notable for the following components:   Eosinophils Absolute 0.9 (*)    All other components within normal limits  COMPREHENSIVE METABOLIC PANEL - Abnormal; Notable for the following components:   Glucose, Bld 163 (*)    BUN 36 (*)    Creatinine, Ser 1.34 (*)    Total Protein 8.2 (*)    GFR calc non Af Amer 53 (*)    All other components within normal limits  PROTIME-INR - Abnormal; Notable for the following components:   Prothrombin Time 18.5 (*)     INR 1.6 (*)    All other components within normal limits  URINALYSIS, ROUTINE W REFLEX MICROSCOPIC - Abnormal; Notable for the following components:   Color, Urine COLORLESS (*)    Glucose, UA >=500 (*)    All other components within normal limits  GLUCOSE, CAPILLARY - Abnormal; Notable for the following components:   Glucose-Capillary 147 (*)    All other components within normal limits  SURGICAL PCR SCREEN  TYPE AND SCREEN     IMAGES:   EKG: 01/27/2020 Rate 58 bpm  Sinus bradycardia Cannot rule out Anterior infarct , age undetermined Abnormal ECG No previous tracing  CV: Echo 11/07/2019 Left Ventricle: The calculated left ventricular ejection fraction is  53%. Systolic function is low normal. EF: 50-55%. Doppler parameters  consistent with mild diastolic dysfunction and low to normal LA pressure.  . Left Atrium: Left atrium is mildlydilated.  . Aortic Valve: Mild regurgitation.  . Mitral Valve: The leaflets are mildly thickened and exhibit normal  excursion. There is mild to moderate regurgitation.  . Pericardium: There is no pericardial effusion.  Past Medical History:  Diagnosis Date  . Asthma   . Atrial flutter (HCC)   . Cardiomyopathy (HCC)   . CHF (congestive heart failure) (HCC)   . Chronic kidney disease   .  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  Marland Kitchen DVT (deep venous thrombosis) (HCC)   . Hyperlipidemia   . Hypertension   . Paroxysmal atrial fibrillation (HCC)   . Thyroid disease     Past Surgical History:  Procedure Laterality Date  . HERNIA REPAIR     inguinal    MEDICATIONS: . albuterol (PROVENTIL) (2.5 MG/3ML) 0.083% nebulizer solution  . albuterol (VENTOLIN HFA) 108 (90 Base) MCG/ACT inhaler  . Ascorbic Acid (VITAMIN C) 1000 MG tablet  . aspirin EC 81 MG tablet  . atorvastatin (LIPITOR) 20 MG tablet  . azithromycin (ZITHROMAX) 250 MG tablet  . cetirizine (ZYRTEC) 10 MG tablet  .  empagliflozin (JARDIANCE) 25 MG TABS tablet  . furosemide (LASIX) 40 MG tablet  . HYDROcodone-homatropine (HYCODAN) 5-1.5 MG/5ML syrup  . ipratropium (ATROVENT) 0.02 % nebulizer solution  . Levothyroxine Sodium 125 MCG CAPS  . losartan (COZAAR) 50 MG tablet  . meloxicam (MOBIC) 15 MG tablet  . methylPREDNIsolone (MEDROL DOSPACK) 4 MG tablet  . metoprolol succinate (TOPROL-XL) 50 MG 24 hr tablet  . rivaroxaban (XARELTO) 20 MG TABS tablet  . sitaGLIPtin-metformin (JANUMET) 50-1000 MG tablet   No current facility-administered medications for this encounter.     Jodell Cipro, PA-C [facility] [phone]

## 2020-01-28 NOTE — Progress Notes (Signed)
Pt stated that he would have granddaughter read and instructions and contact me, if they had any questions. No call received on 01-27-20.   01-28-20 Left voice message for pt's granddaughter to return my call..Awaiting return call.

## 2020-01-30 ENCOUNTER — Ambulatory Visit (HOSPITAL_COMMUNITY): Payer: No Typology Code available for payment source | Admitting: Certified Registered"

## 2020-01-30 ENCOUNTER — Ambulatory Visit (HOSPITAL_COMMUNITY)
Admission: RE | Admit: 2020-01-30 | Discharge: 2020-01-30 | Disposition: A | Discharge status: Home / Self Care | Payer: No Typology Code available for payment source | Attending: Orthopedic Surgery | Admitting: Orthopedic Surgery

## 2020-01-30 ENCOUNTER — Ambulatory Visit (HOSPITAL_COMMUNITY): Payer: Medicare HMO | Attending: Surgical

## 2020-01-30 ENCOUNTER — Encounter (HOSPITAL_COMMUNITY)
Admission: RE | Disposition: A | Discharge status: Home / Self Care | Payer: Self-pay | Source: Home / Self Care | Attending: Orthopedic Surgery

## 2020-01-30 ENCOUNTER — Encounter (HOSPITAL_COMMUNITY): Payer: Self-pay | Admitting: Orthopedic Surgery

## 2020-01-30 ENCOUNTER — Ambulatory Visit (HOSPITAL_COMMUNITY): Payer: No Typology Code available for payment source | Admitting: Physician Assistant

## 2020-01-30 DIAGNOSIS — Z96611 Presence of right artificial shoulder joint: Secondary | ICD-10-CM | POA: Insufficient documentation

## 2020-01-30 DIAGNOSIS — E785 Hyperlipidemia, unspecified: Secondary | ICD-10-CM | POA: Insufficient documentation

## 2020-01-30 DIAGNOSIS — E1122 Type 2 diabetes mellitus with diabetic chronic kidney disease: Secondary | ICD-10-CM | POA: Diagnosis not present

## 2020-01-30 DIAGNOSIS — Z79890 Hormone replacement therapy (postmenopausal): Secondary | ICD-10-CM | POA: Diagnosis not present

## 2020-01-30 DIAGNOSIS — X58XXXA Exposure to other specified factors, initial encounter: Secondary | ICD-10-CM | POA: Diagnosis not present

## 2020-01-30 DIAGNOSIS — Z87891 Personal history of nicotine dependence: Secondary | ICD-10-CM | POA: Insufficient documentation

## 2020-01-30 DIAGNOSIS — I480 Paroxysmal atrial fibrillation: Secondary | ICD-10-CM | POA: Insufficient documentation

## 2020-01-30 DIAGNOSIS — J45909 Unspecified asthma, uncomplicated: Secondary | ICD-10-CM | POA: Diagnosis not present

## 2020-01-30 DIAGNOSIS — Z7982 Long term (current) use of aspirin: Secondary | ICD-10-CM | POA: Insufficient documentation

## 2020-01-30 DIAGNOSIS — S46011A Strain of muscle(s) and tendon(s) of the rotator cuff of right shoulder, initial encounter: Secondary | ICD-10-CM | POA: Diagnosis not present

## 2020-01-30 DIAGNOSIS — Z7984 Long term (current) use of oral hypoglycemic drugs: Secondary | ICD-10-CM | POA: Insufficient documentation

## 2020-01-30 DIAGNOSIS — I509 Heart failure, unspecified: Secondary | ICD-10-CM | POA: Diagnosis not present

## 2020-01-30 DIAGNOSIS — Z79899 Other long term (current) drug therapy: Secondary | ICD-10-CM | POA: Insufficient documentation

## 2020-01-30 DIAGNOSIS — N189 Chronic kidney disease, unspecified: Secondary | ICD-10-CM | POA: Insufficient documentation

## 2020-01-30 DIAGNOSIS — E079 Disorder of thyroid, unspecified: Secondary | ICD-10-CM | POA: Diagnosis not present

## 2020-01-30 DIAGNOSIS — I429 Cardiomyopathy, unspecified: Secondary | ICD-10-CM | POA: Diagnosis not present

## 2020-01-30 DIAGNOSIS — I130 Hypertensive heart and chronic kidney disease with heart failure and stage 1 through stage 4 chronic kidney disease, or unspecified chronic kidney disease: Secondary | ICD-10-CM | POA: Insufficient documentation

## 2020-01-30 DIAGNOSIS — Z7901 Long term (current) use of anticoagulants: Secondary | ICD-10-CM | POA: Insufficient documentation

## 2020-01-30 DIAGNOSIS — Z86718 Personal history of other venous thrombosis and embolism: Secondary | ICD-10-CM | POA: Diagnosis not present

## 2020-01-30 HISTORY — PX: REVERSE SHOULDER ARTHROPLASTY: SHX5054

## 2020-01-30 LAB — TYPE AND SCREEN
ABO/RH(D): O POS
Antibody Screen: NEGATIVE

## 2020-01-30 LAB — GLUCOSE, CAPILLARY
Glucose-Capillary: 186 mg/dL — ABNORMAL HIGH (ref 70–99)
Glucose-Capillary: 157 mg/dL — ABNORMAL HIGH (ref 70–99)

## 2020-01-30 LAB — ABO/RH: ABO/RH(D): O POS

## 2020-01-30 SURGERY — ARTHROPLASTY, SHOULDER, TOTAL, REVERSE
Anesthesia: Regional | Site: Shoulder | Laterality: Right

## 2020-01-30 MED ORDER — TIZANIDINE HCL 4 MG PO TABS: 4.0000 mg | ORAL_TABLET | Freq: Three times a day (TID) | ORAL | 1 refills | Status: DC | PRN

## 2020-01-30 MED ORDER — OXYCODONE-ACETAMINOPHEN 5-325 MG PO TABS: ORAL_TABLET | ORAL | 0 refills | Status: DC

## 2020-01-30 MED ORDER — PHENYLEPHRINE 40 MCG/ML (10ML) SYRINGE FOR IV PUSH (FOR BLOOD PRESSURE SUPPORT)
PREFILLED_SYRINGE | INTRAVENOUS | Status: DC | PRN
  Administered 2020-01-30 (×2): 80 ug via INTRAVENOUS

## 2020-01-30 MED ORDER — FENTANYL CITRATE (PF) 250 MCG/5ML IJ SOLN
INTRAMUSCULAR | Status: DC | PRN
  Administered 2020-01-30: 50 ug via INTRAVENOUS

## 2020-01-30 MED ORDER — OXYCODONE HCL 5 MG PO TABS: 5.0000 mg | ORAL_TABLET | Freq: Once | ORAL | Status: DC | PRN

## 2020-01-30 MED ORDER — PHENYLEPHRINE HCL (PRESSORS) 10 MG/ML IV SOLN
INTRAVENOUS | Status: AC
  Filled 2020-01-30: qty 1

## 2020-01-30 MED ORDER — PROPOFOL 10 MG/ML IV BOLUS
INTRAVENOUS | Status: DC | PRN
  Administered 2020-01-30: 150 mg via INTRAVENOUS
  Administered 2020-01-30: 30 mg via INTRAVENOUS

## 2020-01-30 MED ORDER — WATER FOR IRRIGATION, STERILE IR SOLN
Status: DC | PRN
  Administered 2020-01-30: 2000 mL

## 2020-01-30 MED ORDER — PHENYLEPHRINE HCL (PRESSORS) 10 MG/ML IV SOLN
INTRAVENOUS | Status: AC
  Filled 2020-01-30: qty 2

## 2020-01-30 MED ORDER — PROPOFOL 10 MG/ML IV BOLUS
INTRAVENOUS | Status: AC
  Filled 2020-01-30: qty 20

## 2020-01-30 MED ORDER — DEXAMETHASONE SODIUM PHOSPHATE 10 MG/ML IJ SOLN
INTRAMUSCULAR | Status: AC
  Filled 2020-01-30: qty 1

## 2020-01-30 MED ORDER — LIDOCAINE 2% (20 MG/ML) 5 ML SYRINGE
INTRAMUSCULAR | Status: AC
  Filled 2020-01-30: qty 5

## 2020-01-30 MED ORDER — PHENYLEPHRINE HCL-NACL 10-0.9 MG/250ML-% IV SOLN
INTRAVENOUS | Status: DC | PRN
Start: 2020-01-30 — End: 2020-01-30
  Administered 2020-01-30: 20 ug/min via INTRAVENOUS

## 2020-01-30 MED ORDER — ORAL CARE MOUTH RINSE: 15.0000 mL | Freq: Once | OROMUCOSAL | Status: AC

## 2020-01-30 MED ORDER — 0.9 % SODIUM CHLORIDE (POUR BTL) OPTIME
TOPICAL | Status: DC | PRN
  Administered 2020-01-30: 1000 mL

## 2020-01-30 MED ORDER — LACTATED RINGERS IV SOLN: INTRAVENOUS | Status: DC

## 2020-01-30 MED ORDER — LIDOCAINE 2% (20 MG/ML) 5 ML SYRINGE
INTRAMUSCULAR | Status: DC | PRN
  Administered 2020-01-30: 40 mg via INTRAVENOUS

## 2020-01-30 MED ORDER — BUPIVACAINE LIPOSOME 1.3 % IJ SUSP
INTRAMUSCULAR | Status: DC | PRN
  Administered 2020-01-30: 10 mL via PERINEURAL

## 2020-01-30 MED ORDER — DEXAMETHASONE SODIUM PHOSPHATE 10 MG/ML IJ SOLN
INTRAMUSCULAR | Status: DC | PRN
  Administered 2020-01-30: 8 mg via INTRAVENOUS

## 2020-01-30 MED ORDER — BUPIVACAINE HCL (PF) 0.5 % IJ SOLN
INTRAMUSCULAR | Status: DC | PRN
Start: 2020-01-30 — End: 2020-01-30
  Administered 2020-01-30: 15 mL via PERINEURAL

## 2020-01-30 MED ORDER — FENTANYL CITRATE (PF) 100 MCG/2ML IJ SOLN
INTRAMUSCULAR | Status: AC
  Filled 2020-01-30: qty 2

## 2020-01-30 MED ORDER — ONDANSETRON HCL 4 MG/2ML IJ SOLN
INTRAMUSCULAR | Status: DC | PRN
  Administered 2020-01-30: 4 mg via INTRAVENOUS

## 2020-01-30 MED ORDER — FENTANYL CITRATE (PF) 100 MCG/2ML IJ SOLN: 25.0000 ug | INTRAMUSCULAR | Status: DC | PRN

## 2020-01-30 MED ORDER — TRANEXAMIC ACID-NACL 1000-0.7 MG/100ML-% IV SOLN
1000.0000 mg | INTRAVENOUS | Status: DC
  Filled 2020-01-30: qty 100

## 2020-01-30 MED ORDER — CHLORHEXIDINE GLUCONATE 0.12 % MT SOLN
15.0000 mL | Freq: Once | OROMUCOSAL | Status: AC
  Administered 2020-01-30: 15 mL via OROMUCOSAL

## 2020-01-30 MED ORDER — FENTANYL CITRATE (PF) 100 MCG/2ML IJ SOLN
50.0000 ug | Freq: Once | INTRAMUSCULAR | Status: AC
  Administered 2020-01-30: 50 ug via INTRAVENOUS
  Filled 2020-01-30: qty 2

## 2020-01-30 MED ORDER — ROCURONIUM BROMIDE 10 MG/ML (PF) SYRINGE
PREFILLED_SYRINGE | INTRAVENOUS | Status: DC | PRN
  Administered 2020-01-30: 60 mg via INTRAVENOUS

## 2020-01-30 MED ORDER — ONDANSETRON HCL 4 MG/2ML IJ SOLN: 4.0000 mg | Freq: Once | INTRAMUSCULAR | Status: DC | PRN

## 2020-01-30 MED ORDER — SUGAMMADEX SODIUM 200 MG/2ML IV SOLN
INTRAVENOUS | Status: DC | PRN
  Administered 2020-01-30: 300 mg via INTRAVENOUS

## 2020-01-30 MED ORDER — CEFAZOLIN SODIUM-DEXTROSE 2-4 GM/100ML-% IV SOLN
2.0000 g | INTRAVENOUS | Status: AC
  Administered 2020-01-30: 2 g via INTRAVENOUS
  Filled 2020-01-30: qty 100

## 2020-01-30 MED ORDER — ROCURONIUM BROMIDE 10 MG/ML (PF) SYRINGE
PREFILLED_SYRINGE | INTRAVENOUS | Status: AC
  Filled 2020-01-30: qty 10

## 2020-01-30 MED ORDER — OXYCODONE HCL 5 MG/5ML PO SOLN: 5.0000 mg | Freq: Once | ORAL | Status: DC | PRN

## 2020-01-30 MED ORDER — MIDAZOLAM HCL 2 MG/2ML IJ SOLN
1.0000 mg | Freq: Once | INTRAMUSCULAR | Status: DC
  Filled 2020-01-30: qty 2

## 2020-01-30 MED ORDER — ONDANSETRON HCL 4 MG/2ML IJ SOLN
INTRAMUSCULAR | Status: AC
  Filled 2020-01-30: qty 2

## 2020-01-30 SURGICAL SUPPLY — 78 items
BAG ZIPLOCK 12X15 (MISCELLANEOUS) ×3 IMPLANT
BASEPLATE P2 COATD GLND 6.5X30 (Shoulder) ×1 IMPLANT
BIT DRILL 1.6MX128 (BIT) IMPLANT
BIT DRILL 1.6MX128MM (BIT)
BIT DRILL 2.5 DIA 127 CALI (BIT) ×3 IMPLANT
BIT DRILL 4 DIA CALIBRATED (BIT) ×3 IMPLANT
BLADE SAW SAG 73X25 THK (BLADE) ×2
BLADE SAW SGTL 73X25 THK (BLADE) ×1 IMPLANT
BOOTIES KNEE HIGH SLOAN (MISCELLANEOUS) ×6 IMPLANT
CLOSURE STERI-STRIP 1/2X4 (GAUZE/BANDAGES/DRESSINGS) ×1
CLOSURE WOUND 1/2 X4 (GAUZE/BANDAGES/DRESSINGS)
CLSR STERI-STRIP ANTIMIC 1/2X4 (GAUZE/BANDAGES/DRESSINGS) ×2 IMPLANT
COOLER ICEMAN CLASSIC (MISCELLANEOUS) IMPLANT
COVER BACK TABLE 60X90IN (DRAPES) ×3 IMPLANT
COVER SURGICAL LIGHT HANDLE (MISCELLANEOUS) ×3 IMPLANT
COVER WAND RF STERILE (DRAPES) ×3 IMPLANT
DRAPE INCISE IOBAN 66X45 STRL (DRAPES) ×3 IMPLANT
DRAPE ORTHO SPLIT 77X108 STRL (DRAPES) ×4
DRAPE POUCH INSTRU U-SHP 10X18 (DRAPES) ×6 IMPLANT
DRAPE SHEET LG 3/4 BI-LAMINATE (DRAPES) ×6 IMPLANT
DRAPE SURG 17X11 SM STRL (DRAPES) ×3 IMPLANT
DRAPE SURG ORHT 6 SPLT 77X108 (DRAPES) ×2 IMPLANT
DRAPE TOP 10253 STERILE (DRAPES) ×3 IMPLANT
DRAPE U-SHAPE 47X51 STRL (DRAPES) ×3 IMPLANT
DRSG AQUACEL AG ADV 3.5X 6 (GAUZE/BANDAGES/DRESSINGS) ×3 IMPLANT
DURAPREP 26ML APPLICATOR (WOUND CARE) ×3 IMPLANT
ELECT BLADE TIP CTD 4 INCH (ELECTRODE) ×3 IMPLANT
ELECT REM PT RETURN 15FT ADLT (MISCELLANEOUS) ×3 IMPLANT
GLOVE BIO SURGEON STRL SZ7 (GLOVE) ×3 IMPLANT
GLOVE BIO SURGEON STRL SZ7.5 (GLOVE) ×3 IMPLANT
GLOVE BIOGEL PI IND STRL 7.0 (GLOVE) ×1 IMPLANT
GLOVE BIOGEL PI IND STRL 8 (GLOVE) ×1 IMPLANT
GLOVE BIOGEL PI INDICATOR 7.0 (GLOVE) ×2
GLOVE BIOGEL PI INDICATOR 8 (GLOVE) ×2
GOWN STRL REUS W/TWL LRG LVL3 (GOWN DISPOSABLE) ×3 IMPLANT
GOWN STRL REUS W/TWL XL LVL3 (GOWN DISPOSABLE) ×3 IMPLANT
HANDPIECE INTERPULSE COAX TIP (DISPOSABLE) ×2
HOOD PEEL AWAY FLYTE STAYCOOL (MISCELLANEOUS) ×9 IMPLANT
INSERT EPOLY STND HUMERUS 32MM (Shoulder) ×3 IMPLANT
INSERT EPOLYSTD HUMERUS 32MM (Shoulder) ×1 IMPLANT
KIT BASIN OR (CUSTOM PROCEDURE TRAY) ×3 IMPLANT
KIT TURNOVER KIT A (KITS) IMPLANT
MANIFOLD NEPTUNE II (INSTRUMENTS) ×3 IMPLANT
NEEDLE TROCAR POINT SZ 2 1/2 (NEEDLE) IMPLANT
NS IRRIG 1000ML POUR BTL (IV SOLUTION) ×3 IMPLANT
P2 COATDE GLNOID BSEPLT 6.5X30 (Shoulder) ×3 IMPLANT
PACK SHOULDER (CUSTOM PROCEDURE TRAY) ×3 IMPLANT
PAD COLD SHLDR WRAP-ON (PAD) IMPLANT
PROTECTOR NERVE ULNAR (MISCELLANEOUS) IMPLANT
RESTRAINT HEAD UNIVERSAL NS (MISCELLANEOUS) IMPLANT
RETRIEVER SUT HEWSON (MISCELLANEOUS) IMPLANT
SCREW BONE LOCKING RSP 5.0X14 (Screw) ×6 IMPLANT
SCREW BONE LOCKING RSP 5.0X30 (Screw) ×6 IMPLANT
SCREW BONE RSP LOCK 5X14 (Screw) ×2 IMPLANT
SCREW BONE RSP LOCK 5X30 (Screw) ×2 IMPLANT
SCREW RETAIN W/HEAD 32MM (Shoulder) ×3 IMPLANT
SET HNDPC FAN SPRY TIP SCT (DISPOSABLE) ×1 IMPLANT
SLING ARM FOAM STRAP LRG (SOFTGOODS) ×3 IMPLANT
SLING ARM IMMOBILIZER LRG (SOFTGOODS) IMPLANT
SLING ARM IMMOBILIZER MED (SOFTGOODS) IMPLANT
SPONGE LAP 18X18 RF (DISPOSABLE) ×3 IMPLANT
STEM HUMERAL 12X48 STD SHORT (Shoulder) ×3 IMPLANT
STRIP CLOSURE SKIN 1/2X4 (GAUZE/BANDAGES/DRESSINGS) IMPLANT
SUCTION FRAZIER HANDLE 10FR (MISCELLANEOUS)
SUCTION TUBE FRAZIER 10FR DISP (MISCELLANEOUS) IMPLANT
SUPPORT WRAP ARM LG (MISCELLANEOUS) IMPLANT
SUT ETHIBOND 2 V 37 (SUTURE) ×3 IMPLANT
SUT FIBERWIRE #2 38 REV NDL BL (SUTURE) ×3
SUT MNCRL AB 4-0 PS2 18 (SUTURE) ×3 IMPLANT
SUT VIC AB 2-0 CT1 27 (SUTURE) ×4
SUT VIC AB 2-0 CT1 TAPERPNT 27 (SUTURE) ×2 IMPLANT
SUTURE FIBERWR#2 38 REV NDL BL (SUTURE) ×1 IMPLANT
TAPE LABRALWHITE 1.5X36 (TAPE) IMPLANT
TAPE SUT LABRALTAP WHT/BLK (SUTURE) IMPLANT
TOWEL OR 17X26 10 PK STRL BLUE (TOWEL DISPOSABLE) ×3 IMPLANT
TOWEL OR NON WOVEN STRL DISP B (DISPOSABLE) ×3 IMPLANT
WATER STERILE IRR 1000ML POUR (IV SOLUTION) ×6 IMPLANT
YANKAUER SUCT BULB TIP 10FT TU (MISCELLANEOUS) ×3 IMPLANT

## 2020-01-30 NOTE — Op Note (Signed)
Procedure(s): REVERSE SHOULDER ARTHROPLASTY Procedure Note  William Velasquez male 74 y.o. 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 male  With irreparable right rotator cuff tear after an injury at work.  Pain and dysfunction interfered with quality of life and nonoperative treatment with activity modification, NSAIDS and injections failed.     Surgeon: Berline Lopes   Assistants: Damita Lack PA-C [facility] was present and scrubbed throughout the procedure and was essential in positioning, retraction, exposure, and closure)   Procedure Detail  REVERSE SHOULDER ARTHROPLASTY   Estimated Blood Loss:  200 mL         Drains: none  Blood Given: none          Specimens: none        Complications:  * No complications entered in OR log *         Disposition: PACU - hemodynamically stable.         Condition: stable      OPERATIVE FINDINGS:  A DJO Altivate pressfit reverse total shoulder arthroplasty was placed with a  size 12 stem, a 32 glenosphere, and a standard poly insert. The base plate  fixation was excellent.  PROCEDURE: The patient was identified in the preoperative holding area  where I personally marked the operative site after verifying site, side,  and procedure with the patient. An interscalene block given by  the attending anesthesiologist in the holding area and the patient was taken back to the operating room where all extremities were  carefully padded in position after general anesthesia was induced. She  was placed in a beach-chair position and the operative upper extremity was  prepped and draped in a standard sterile fashion. An approximately 10-  cm incision was made from the tip of the coracoid process to the center  point of the humerus at  the level of the axilla. Dissection was carried  down through subcutaneous tissues to the level of the cephalic vein  which was taken laterally with the deltoid. The pectoralis major was  retracted medially. The subdeltoid space was developed and the lateral  edge of the conjoined tendon was identified. The undersurface of  conjoined tendon was palpated and the musculocutaneous nerve was not in  the field. Retractor was placed underneath the conjoined and second  retractor was placed lateral into the deltoid. The circumflex humeral  artery and vessels were identified and clamped and coagulated. The  biceps tendon was tenodesed to the upper border of the pectoralis major.  The subscapularis was taken down as a peel with the underlying capsule.  The  joint was then gently externally rotated while the capsule was released  from the humeral neck around to just beyond the 6 o'clock position. At  this point, the joint was dislocated and the humeral head was presented  into the wound. The excessive osteophyte formation was removed with a  large rongeur.  The cutting guide was used to make the appropriate  head cut and the head was saved for potentially bone grafting.  The glenoid was exposed with the arm in an  abducted extended position. The anterior and posterior labrum were  completely excised and the capsule was released circumferentially to  allow for exposure of the glenoid for preparation. The 2.5 mm drill was  placed using the guide in 5-10 inferior  angulation and the tap was then advanced in the same hole. Small and large reamers were then used. The tap was then removed and the Metaglene was then screwed in with excellent purchase.  The peripheral guide was then used to drilled measured and filled peripheral locking screws. The size 32 glenosphere was then impacted on the [facility] taper and the central screw was placed. The humerus was then again exposed and the diaphyseal reamers were used  followed by the metaphyseal reamers. The final broach was left in place in the proximal trial was placed. The joint was reduced and with this implant it was felt that soft tissue tensioning was appropriate with excellent stability and excellent range of motion. Therefore, final humeral stem was placed press-fit.  And then the trial polyethylene inserts were tested again and the above implant was felt to be the most appropriate for final insertion. The joint was reduced taken through full range of motion and felt to be stable. Soft tissue tension was appropriate.  The joint was then copiously irrigated with pulse  lavage and the wound was then closed. The subscapularis was repaired back to the lesser tuberosity with a #2 FiberWire.  Skin was closed with 2-0 Vicryl in a deep dermal layer and 4-0  Monocryl for skin closure. Steri-Strips were applied. Sterile  dressings were then applied as well as a sling. The patient was allowed  to awaken from general anesthesia, transferred to stretcher, and taken  to recovery room in stable condition.   POSTOPERATIVE PLAN: The patient will be observed in the recovery room.  If he is physiologically stable and his pain is well controlled with his regional block he could be discharged home today with family.

## 2020-01-30 NOTE — H&P (Signed)
William Velasquez is an 74 y.o. male.   Chief Complaint: R shoulder pain and dsyfunction HPI: Status post injury at work with massive irreparable rotator cuff tear and associated arthropathy with significant pain and dysfunction, failed conservative measures.  Pain interferes with sleep and quality of life.   Past Medical History:  Diagnosis Date  . Asthma   . Atrial flutter (HCC)   . Cardiomyopathy (HCC)   . CHF (congestive heart failure) (HCC)   . Chronic kidney disease   . 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  Marland Kitchen DVT (deep venous thrombosis) (HCC)   . Hyperlipidemia   . Hypertension   . Paroxysmal atrial fibrillation (HCC)   . Thyroid disease     Past Surgical History:  Procedure Laterality Date  . HERNIA REPAIR     inguinal    History reviewed. No pertinent family history. Social History:  reports that he quit smoking about 13 years ago. His smoking use included cigarettes. He has a 20.00 pack-year smoking history. He has never used smokeless tobacco. He reports that he does not drink alcohol and does not use drugs.  Allergies:  Allergies  Allergen Reactions  . Lisinopril Cough    Medications Prior to Admission  Medication Sig Dispense Refill  . albuterol (VENTOLIN HFA) 108 (90 Base) MCG/ACT inhaler Inhale 1-2 puffs into the lungs every 6 (six) hours as needed for wheezing or shortness of breath.    . Ascorbic Acid (VITAMIN C) 1000 MG tablet Take 1,000 mg by mouth daily.    Marland Kitchen aspirin EC 81 MG tablet Take 81 mg by mouth daily. Swallow whole.    Marland Kitchen atorvastatin (LIPITOR) 20 MG tablet Take 10 mg by mouth daily at 6 PM.     . cetirizine (ZYRTEC) 10 MG tablet Take 10 mg by mouth daily.    . empagliflozin (JARDIANCE) 25 MG TABS tablet Take 25 mg by mouth daily.    . furosemide (LASIX) 40 MG tablet Take 40 mg by mouth See admin instructions. 40mg  q AM and 20mg  q Evening    . Levothyroxine Sodium 125 MCG CAPS Take 125 mcg by  mouth daily before breakfast.     . losartan (COZAAR) 50 MG tablet Take 50 mg by mouth daily.     . metoprolol succinate (TOPROL-XL) 50 MG 24 hr tablet Take 50 mg by mouth in the morning and at bedtime. Take with or immediately following a meal.    . rivaroxaban (XARELTO) 20 MG TABS tablet Take 20 mg by mouth daily with supper.    . sitaGLIPtin-metformin (JANUMET) 50-1000 MG tablet Take 1 tablet by mouth 2 (two) times daily with a meal.    . albuterol (PROVENTIL) (2.5 MG/3ML) 0.083% nebulizer solution Take 3 mLs (2.5 mg total) by nebulization every 6 (six) hours as needed for wheezing or shortness of breath. ICD-10  DX  J18.9, R0.602 (Patient not taking: Reported on 01/27/2020) 150 mL 1  . azithromycin (ZITHROMAX) 250 MG tablet Take 2 tabs po now then 1 tab po daily for the next 4 days (Patient not taking: Reported on 01/27/2020) 6 tablet 0  . HYDROcodone-homatropine (HYCODAN) 5-1.5 MG/5ML syrup Take 5 mLs by mouth at bedtime as needed. (Patient not taking: Reported on 01/27/2020) 120 mL 0  . ipratropium (ATROVENT) 0.02 % nebulizer solution Take 2.5 mLs (0.5 mg total) by nebulization every 6 (six) hours as needed for wheezing or shortness of breath. ICD-10 DX J18.9, R0.602 (Patient not taking:  Reported on 01/27/2020) 75 mL 1  . meloxicam (MOBIC) 15 MG tablet Take 15 mg by mouth daily as needed for pain.    . methylPREDNIsolone (MEDROL DOSPACK) 4 MG tablet follow package directions (Patient not taking: Reported on 01/27/2020) 21 tablet 0    Results for orders placed or performed during the hospital encounter of 01/30/20 (from the past 48 hour(s))  Glucose, capillary     Status: Abnormal   Collection Time: 01/30/20  8:34 AM  Result Value Ref Range   Glucose-Capillary 186 (H) 70 - 99 mg/dL    Comment: Glucose reference range applies only to samples taken after fasting for at least 8 hours.   No results found.  Review of Systems  Blood pressure (!) 162/72, pulse 69, temperature 98.2 F (36.8 C),  temperature source Oral, resp. rate 18, SpO2 98 %. Physical Exam   Assessment/Plan Status post injury at work with massive irreparable rotator cuff tear and associated arthropathy with significant pain and dysfunction, failed conservative measures.  Pain interferes with sleep and quality of life. Plan R reverse TSA Risks / benefits of surgery discussed Consent on chart  NPO for OR Preop antibiotics   Berline Lopes, MD 01/30/2020, 8:38 AM

## 2020-01-30 NOTE — Discharge Instructions (Signed)

## 2020-01-30 NOTE — Transfer of Care (Signed)
Immediate Anesthesia Transfer of Care Note  Patient: William Velasquez  Procedure(s) Performed: REVERSE SHOULDER ARTHROPLASTY (Right Shoulder)  Patient Location: PACU  Anesthesia Type:General  Level of Consciousness: drowsy and patient cooperative  Airway & Oxygen Therapy: Patient Spontanous Breathing and Patient connected to face mask oxygen  Post-op Assessment: Report given to RN and Post -op Vital signs reviewed and stable  Post vital signs: Reviewed and stable  Last Vitals:  Vitals Value Taken Time  BP    Temp    Pulse 64 01/30/20 1112  Resp 15 01/30/20 1112  SpO2 100 % 01/30/20 1112  Vitals shown include unvalidated device data.  Last Pain:  Vitals:   01/30/20 0906  TempSrc:   PainSc: 0-No pain         Complications: No complications documented.

## 2020-01-30 NOTE — Anesthesia Procedure Notes (Signed)
Anesthesia Regional Block: Interscalene brachial plexus block   Pre-Anesthetic Checklist: ,, timeout performed, Correct Patient, Correct Site, Correct Laterality, Correct Procedure, Correct Position, site marked, Risks and benefits discussed,  Surgical consent,  Pre-op evaluation,  At surgeon's request and post-op pain management  Laterality: Right  Prep: Maximum Sterile Barrier Precautions used, chloraprep       Needles:  Injection technique: Single-shot  Needle Type: Echogenic Stimulator Needle     Needle Length: 9cm  Needle Gauge: 22     Additional Needles:   Procedures:,,,, ultrasound used (permanent image in chart),,,,  Narrative:  Start time: 01/30/2020 8:58 AM End time: 01/30/2020 9:04 AM Injection made incrementally with aspirations every 5 mL.  Performed by: Personally  Anesthesiologist: Lannie Fields, DO  Additional Notes: Monitors applied. No increased pain on injection. No increased resistance to injection. Injection made in 5cc increments. Good needle visualization. Patient tolerated procedure well.

## 2020-01-30 NOTE — Anesthesia Procedure Notes (Signed)
Procedure Name: Intubation Date/Time: 01/30/2020 9:38 AM Performed by: Eben Burow, CRNA Pre-anesthesia Checklist: Patient identified, Emergency Drugs available, Suction available, Patient being monitored and Timeout performed Patient Re-evaluated:Patient Re-evaluated prior to induction Oxygen Delivery Method: Circle system utilized Preoxygenation: Pre-oxygenation with 100% oxygen Induction Type: IV induction Ventilation: Mask ventilation without difficulty Laryngoscope Size: Mac and 4 Grade View: Grade I Tube type: Oral Tube size: 7.5 mm Number of attempts: 1 Airway Equipment and Method: Stylet Placement Confirmation: ETT inserted through vocal cords under direct vision,  positive ETCO2 and breath sounds checked- equal and bilateral Secured at: 22 cm Tube secured with: Tape Dental Injury: Teeth and Oropharynx as per pre-operative assessment

## 2020-01-30 NOTE — Progress Notes (Signed)
0900  Time out completed. 1610 Meds given  0904 Assisted Dr. Lowella Petties with right, ultrasound guided, interscalene  block. Side rails up, monitors on throughout procedure. See vital signs in flow sheet. Tolerated Procedure well.

## 2020-01-30 NOTE — Evaluation (Signed)
Occupational Therapy Evaluation Patient Details Name: William Velasquez MRN: [id number] DOB: January 26, 1946 Today's Date: 01/30/2020    History of Present Illness REVERSE SHOULDER ARTHROPLASTY (Right Shoulder)   Clinical Impression   OT eval and education complete. Handout provided.  Son present.      Follow Up Recommendations  Follow surgeon's recommendation for DC plan and follow-up therapies    Equipment Recommendations  None recommended by OT       Precautions / Restrictions Precautions Precautions: Shoulder Shoulder Interventions: Shoulder sling/immobilizer Required Braces or Orthoses: Sling                     Vision Patient Visual Report: No change from baseline              Pertinent Vitals/Pain Pain Assessment: No/denies pain              Cognition Arousal/Alertness: Awake/alert Behavior During Therapy: WFL for tasks assessed/performed Overall Cognitive Status: Within Functional Limits for tasks assessed                                           Shoulder Instructions Shoulder Instructions Donning/doffing shirt without moving shoulder: Minimal assistance;Patient able to independently direct caregiver;Caregiver independent with task Method for sponge bathing under operated UE: Minimal assistance;Caregiver independent with task;Patient able to independently direct caregiver Donning/doffing sling/immobilizer: Minimal assistance;Caregiver independent with task;Patient able to independently direct caregiver Correct positioning of sling/immobilizer: Minimal assistance;Caregiver independent with task;Patient able to independently direct caregiver ROM for elbow, wrist and digits of operated UE: Minimal assistance;Caregiver independent with task;Patient able to independently direct caregiver Proper positioning of operated UE when showering: Minimal assistance;Caregiver independent with task;Patient able to independently direct  caregiver Positioning of UE while sleeping: Minimal assistance;Caregiver independent with task;Patient able to independently direct caregiver        OT Goals(Current goals can be found in the care plan section) Acute Rehab OT Goals Patient Stated Goal: home today OT Goal Formulation: With patient Time For Goal Achievement: 01/30/20 Potential to Achieve Goals: Good        AM-PAC OT "6 Clicks" Daily Activity     Outcome Measure Help from another person eating meals?: A Little Help from another person taking care of personal grooming?: A Little Help from another person toileting, which includes using toliet, bedpan, or urinal?: A Little Help from another person bathing (including washing, rinsing, drying)?: A Little Help from another person to put on and taking off regular upper body clothing?: A Little Help from another person to put on and taking off regular lower body clothing?: A Little 6 Click Score: 18   End of Session Nurse Communication: Mobility status  Activity Tolerance: Patient tolerated treatment well Patient left: in chair                   Time: 9678-9381 OT Time Calculation (min): 20 min Charges:  OT General Charges $OT Visit: 1 Visit OT Evaluation $OT Eval Moderate Complexity: 1 Mod  William Velasquez, OT Acute Rehabilitation Services [phone] Office- [phone]     William Velasquez, William Velasquez 01/30/2020, 5:11 PM

## 2020-01-30 NOTE — Anesthesia Postprocedure Evaluation (Signed)
Anesthesia Post Note  Patient: Jafet Olthoff  Procedure(s) Performed: REVERSE SHOULDER ARTHROPLASTY (Right Shoulder)     Patient location during evaluation: PACU Anesthesia Type: Regional and General Level of consciousness: awake and alert, oriented and patient cooperative Pain management: pain level controlled Vital Signs Assessment: post-procedure vital signs reviewed and stable Respiratory status: spontaneous breathing, nonlabored ventilation and respiratory function stable Cardiovascular status: blood pressure returned to baseline and stable Postop Assessment: no apparent nausea or vomiting Anesthetic complications: no   No complications documented.  Last Vitals:  Vitals:   01/30/20 1227 01/30/20 1330  BP: (!) 122/58 (!) 112/55  Pulse: 66 71  Resp: 12   Temp:    SpO2: 93% 90%    Last Pain:  Vitals:   01/30/20 1215  TempSrc:   PainSc: 0-No pain                 Lannie Fields

## 2020-01-31 ENCOUNTER — Encounter (HOSPITAL_COMMUNITY): Payer: Self-pay | Admitting: Orthopedic Surgery

## 2021-02-08 IMAGING — CR DG CHEST 2V
2 series · 2 of 2 positions shown · non-contrast
Comparison: 09/15/2014

CLINICAL DATA: Preoperative study for upcoming shoulder replacement

EXAM:
CHEST - 2 VIEW

[w chest pa]
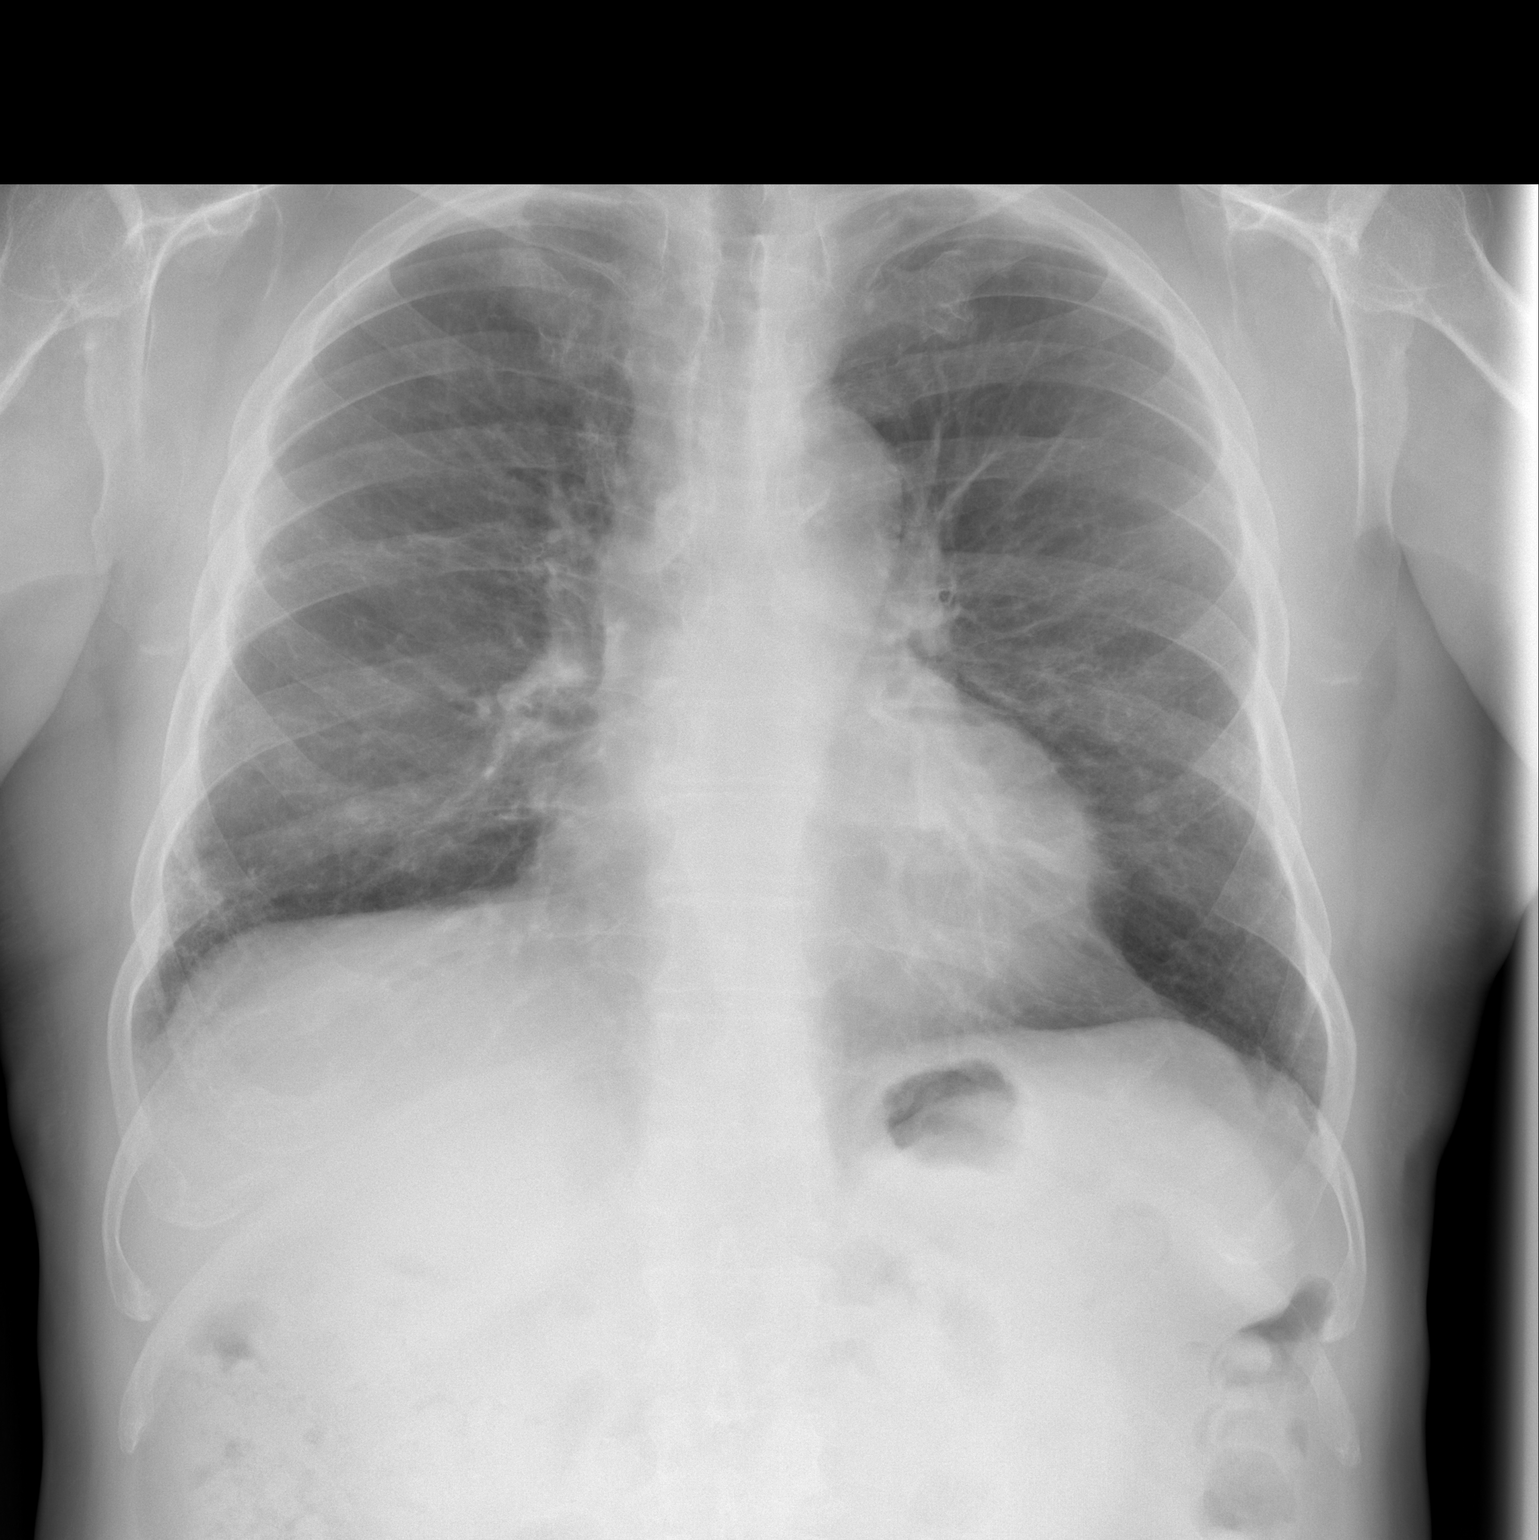

[w chest lat]
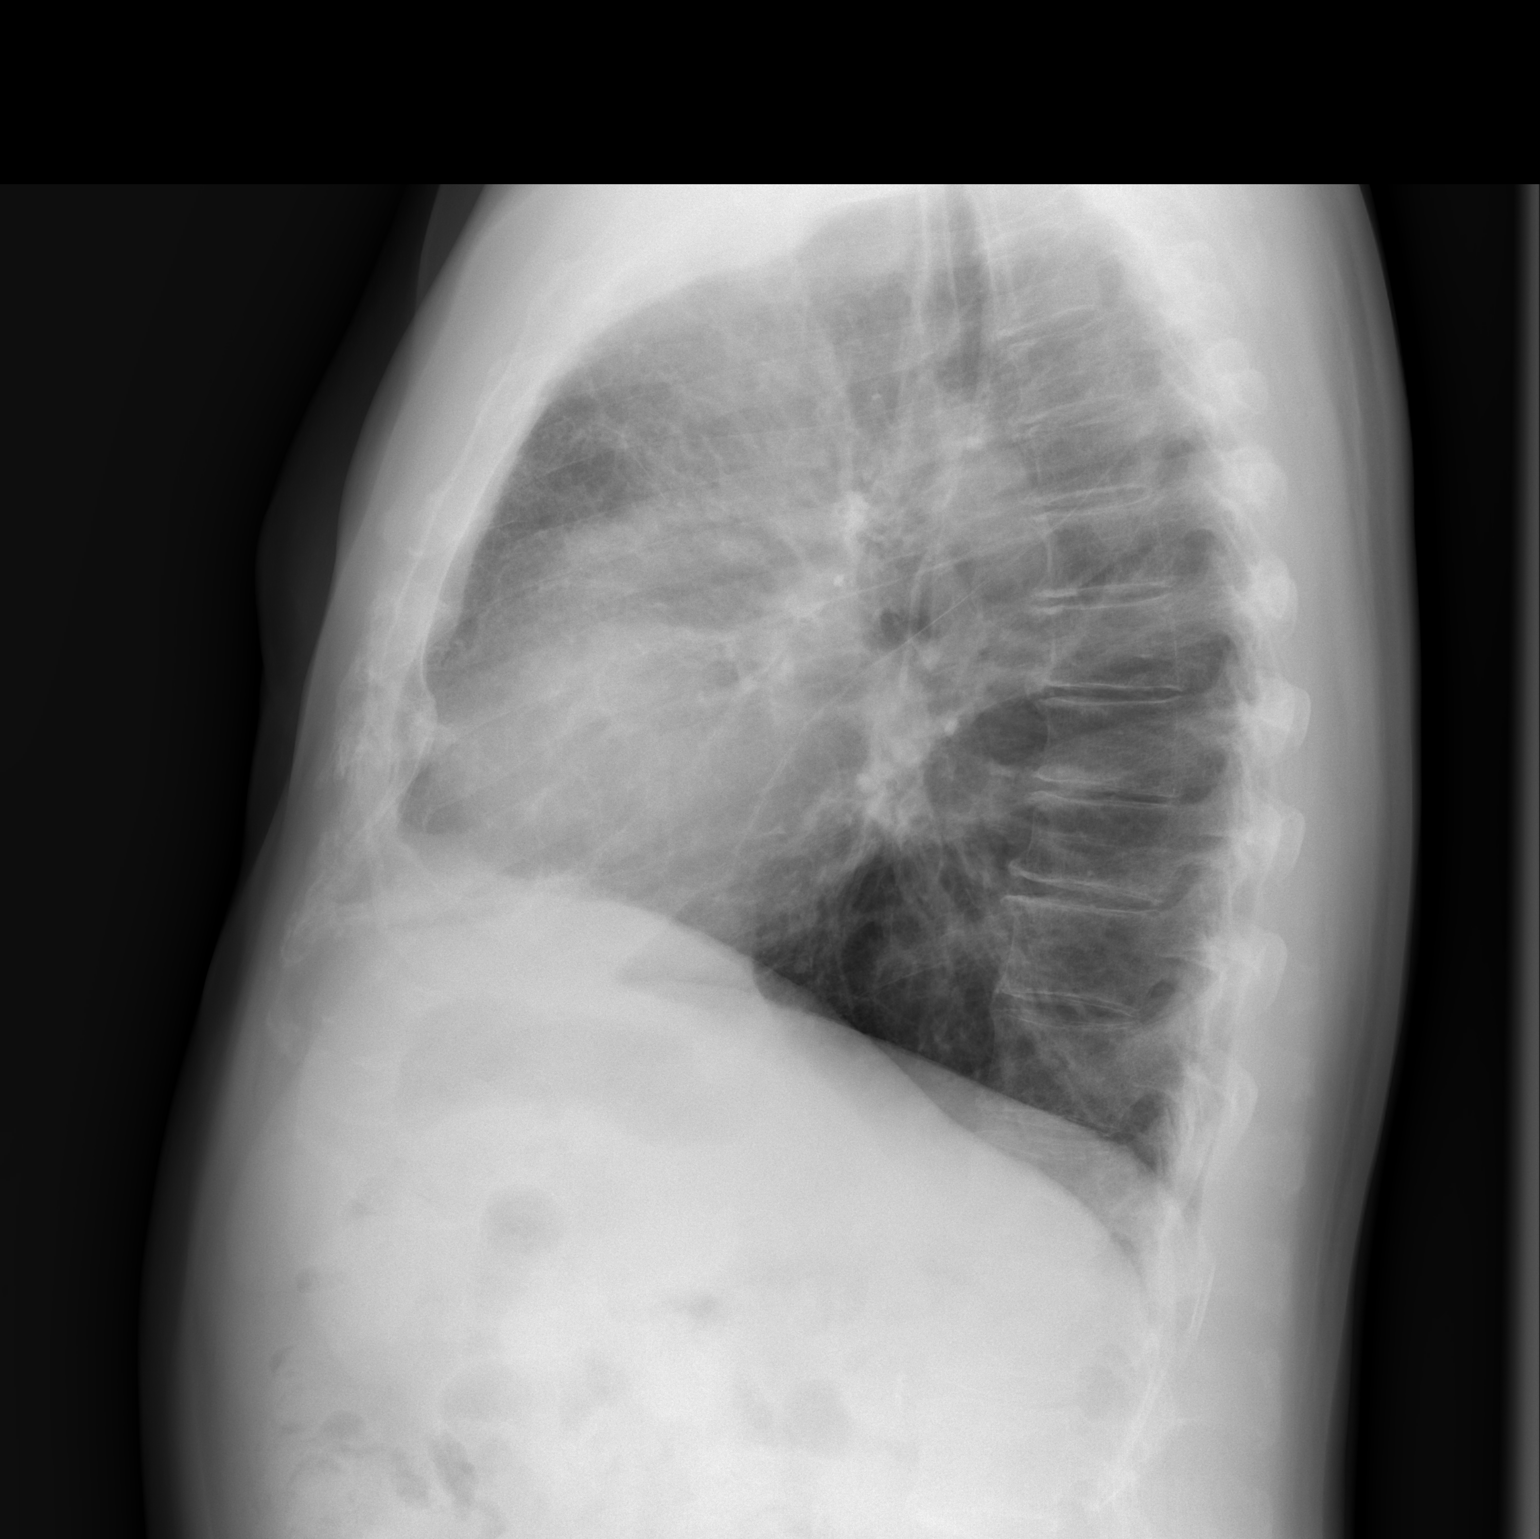

[2 of 2 positions shown; findings below may reference images not displayed]

FINDINGS: Cardiac shadow is within normal limits. The lungs are well aerated
bilaterally. Minimal scarring in noted in the lateral aspect of the
right lung base on the frontal film. Degenerative change in thoracic
spine seen.
IMPRESSION: Minimal scarring in the right base.  No acute abnormality noted.

## 2023-09-26 DEATH — deceased
